# Patient Record
Sex: Female | Born: 1955 | Race: White | Hispanic: No | Marital: Married | Smoking: Never smoker
Health system: Southern US, Community
[De-identification: ages and names within clinical notes are randomized; demographics above are authoritative.]

## PROBLEM LIST (undated history)

## (undated) DIAGNOSIS — C801 Malignant (primary) neoplasm, unspecified: Secondary | ICD-10-CM

## (undated) DIAGNOSIS — N19 Unspecified kidney failure: Secondary | ICD-10-CM

## (undated) DIAGNOSIS — C210 Malignant neoplasm of anus, unspecified: Secondary | ICD-10-CM

## (undated) DIAGNOSIS — I10 Essential (primary) hypertension: Secondary | ICD-10-CM

## (undated) DIAGNOSIS — F32A Depression, unspecified: Secondary | ICD-10-CM

## (undated) DIAGNOSIS — B1920 Unspecified viral hepatitis C without hepatic coma: Secondary | ICD-10-CM

## (undated) DIAGNOSIS — F329 Major depressive disorder, single episode, unspecified: Secondary | ICD-10-CM

## (undated) HISTORY — PX: SMALL BOWEL REPAIR: SHX6447

## (undated) HISTORY — DX: Malignant neoplasm of anus, unspecified: C21.0

## (undated) HISTORY — PX: BACK SURGERY: SHX140

## (undated) HISTORY — DX: Depression, unspecified: F32.A

## (undated) HISTORY — PX: RECTAL BIOPSY: SHX2303

## (undated) HISTORY — DX: Major depressive disorder, single episode, unspecified: F32.9

## (undated) HISTORY — DX: Unspecified viral hepatitis C without hepatic coma: B19.20

## (undated) HISTORY — DX: Unspecified kidney failure: N19

## (undated) HISTORY — DX: Malignant (primary) neoplasm, unspecified: C80.1

## (undated) HISTORY — PX: OTHER SURGICAL HISTORY: SHX169

---

## 2008-05-11 HISTORY — PX: COLONOSCOPY: SHX174

## 2011-06-30 ENCOUNTER — Emergency Department (HOSPITAL_COMMUNITY)
Admission: EM | Admit: 2011-06-30 | Discharge: 2011-07-02 | Disposition: A | Discharge status: Other Acute Inpatient Hospital | Payer: PRIVATE HEALTH INSURANCE | Attending: Emergency Medicine | Admitting: Emergency Medicine

## 2011-06-30 ENCOUNTER — Encounter (HOSPITAL_COMMUNITY): Payer: Self-pay | Admitting: Emergency Medicine

## 2011-06-30 DIAGNOSIS — R45851 Suicidal ideations: Secondary | ICD-10-CM | POA: Insufficient documentation

## 2011-06-30 DIAGNOSIS — M545 Low back pain, unspecified: Secondary | ICD-10-CM | POA: Insufficient documentation

## 2011-06-30 DIAGNOSIS — I10 Essential (primary) hypertension: Secondary | ICD-10-CM | POA: Insufficient documentation

## 2011-06-30 DIAGNOSIS — F411 Generalized anxiety disorder: Secondary | ICD-10-CM | POA: Insufficient documentation

## 2011-06-30 DIAGNOSIS — F329 Major depressive disorder, single episode, unspecified: Secondary | ICD-10-CM | POA: Insufficient documentation

## 2011-06-30 DIAGNOSIS — G8929 Other chronic pain: Secondary | ICD-10-CM | POA: Insufficient documentation

## 2011-06-30 DIAGNOSIS — F3289 Other specified depressive episodes: Secondary | ICD-10-CM | POA: Insufficient documentation

## 2011-06-30 HISTORY — DX: Essential (primary) hypertension: I10

## 2011-06-30 LAB — COMPREHENSIVE METABOLIC PANEL
ALT: 36 U/L — ABNORMAL HIGH (ref 0–35)
Albumin: 4.4 g/dL (ref 3.5–5.2)
BUN: 29 mg/dL — ABNORMAL HIGH (ref 6–23)
Calcium: 11 mg/dL — ABNORMAL HIGH (ref 8.4–10.5)
GFR calc Af Amer: 67 mL/min — ABNORMAL LOW (ref >90)
Glucose, Bld: 98 mg/dL (ref 70–99)
Sodium: 143 mEq/L (ref 135–145)
Total Protein: 7.6 g/dL (ref 6.0–8.3)

## 2011-06-30 LAB — CBC
Hemoglobin: 14.1 g/dL (ref 12.0–15.0)
MCH: 30.7 pg (ref 26.0–34.0)
MCHC: 34.3 g/dL (ref 30.0–36.0)
RDW: 12.2 % (ref 11.5–15.5)

## 2011-06-30 LAB — ETHANOL: Alcohol, Ethyl (B): <11 mg/dL (ref 0–11)

## 2011-06-30 MED ORDER — LORAZEPAM 1 MG PO TABS
1.0000 mg | ORAL_TABLET | Freq: Three times a day (TID) | ORAL | Status: DC | PRN
  Administered 2011-06-30 – 2011-07-01 (×3): 1 mg via ORAL
  Filled 2011-06-30 (×3): qty 1

## 2011-06-30 MED ORDER — ACETAMINOPHEN 325 MG PO TABS: 650.0000 mg | ORAL_TABLET | ORAL | Status: DC | PRN

## 2011-06-30 MED ORDER — ZOLPIDEM TARTRATE 5 MG PO TABS
5.0000 mg | ORAL_TABLET | Freq: Once | ORAL | Status: AC
  Administered 2011-06-30: 5 mg via ORAL
  Filled 2011-06-30: qty 1

## 2011-06-30 MED ORDER — ALPRAZOLAM 0.5 MG PO TABS
1.0000 mg | ORAL_TABLET | Freq: Once | ORAL | Status: AC
  Administered 2011-06-30: 1 mg via ORAL
  Filled 2011-06-30: qty 2

## 2011-06-30 MED ORDER — CYCLOBENZAPRINE HCL 10 MG PO TABS
10.0000 mg | ORAL_TABLET | Freq: Once | ORAL | Status: AC
  Administered 2011-06-30: 10 mg via ORAL
  Filled 2011-06-30: qty 1

## 2011-06-30 NOTE — ED Provider Notes (Signed)
History     CSN: [id number]  Arrival date & time 06/30/11  1603   First MD Initiated Contact with Patient 06/30/11 1946      Chief Complaint  Patient presents with  . Suicidal    HPI Patient presents with overwhelming depression.  She notes a history of depression and anxiety, as well as chronic back pain.  She notes over the past days to weeks her depression has become pronounced enough to incapacitate her.  She notes intermittent prior suicidal thoughts, though none on my evaluation.  Her back pain is lower back, atraumatic, unchanged in the past weeks.  She denies any new narcotic use, any new other illicits.  She continues to take her medications as directed. Past Medical History  Diagnosis Date  . Depressed   . Hypertension     Past Surgical History  Procedure Date  . Back surgery     History reviewed. No pertinent family history.  History  Substance Use Topics  . Smoking status: Never Smoker   . Smokeless tobacco: Not on file  . Alcohol Use: No    OB History    Grav Para Term Preterm Abortions TAB SAB Ect Mult Living                  Review of Systems  All other systems reviewed and are negative.    Allergies  Penicillins and Codeine  Home Medications   Current Outpatient Rx  Name Route Sig Dispense Refill  . ALPRAZOLAM 1 MG PO TABS Oral Take 1 mg by mouth daily as needed. For anxiety    . ASPIRIN EC 81 MG PO TBEC Oral Take 81 mg by mouth daily.    . BUPROPION HCL ER (XL) 150 MG PO TB24 Oral Take 150 mg by mouth every morning.    Marland Kitchen CALCIUM 600 + D PO Oral Take 1 tablet by mouth every morning.    Marland Kitchen VITAMIN D 2000 UNITS PO CAPS Oral Take 1 capsule by mouth every morning.    . CYCLOBENZAPRINE HCL 10 MG PO TABS Oral Take 10 mg by mouth 3 (three) times daily as needed. For pain    . DOCUSATE SODIUM 100 MG PO CAPS Oral Take 200 mg by mouth every morning.    Marland Kitchen LANSOPRAZOLE PO Oral Take 30 mg by mouth daily as needed. For heartburn    .  LISINOPRIL-HYDROCHLOROTHIAZIDE 10-12.5 MG PO TABS Oral Take 1 tablet by mouth daily.    . ADULT MULTIVITAMIN W/MINERALS CH Oral Take 1 tablet by mouth daily.    . OXYCODONE HCL 15 MG PO TABS Oral Take 15 mg by mouth 5 (five) times daily.    Marland Kitchen VILAZODONE HCL 40 MG PO TABS Oral Take 40 mg by mouth daily.    Marland Kitchen ZOLPIDEM TARTRATE ER 12.5 MG PO TBCR Oral Take 12.5 mg by mouth daily.      BP 111/81  Pulse 88  Temp(Src) 98.1 F (36.7 C) (Oral)  Resp 19  SpO2 95%  Physical Exam  Nursing note and vitals reviewed. Constitutional: She is oriented to person, place, and time. She appears well-developed and well-nourished. No distress.  HENT:  Head: Normocephalic and atraumatic.  Eyes: Conjunctivae and EOM are normal.  Pulmonary/Chest: Effort normal. No stridor. No respiratory distress.  Musculoskeletal: She exhibits no edema.  Neurological: She is alert and oriented to person, place, and time. No cranial nerve deficit.  Skin: Skin is warm and dry.  Psychiatric: Her speech is normal. Judgment and  thought content normal. She is slowed. Cognition and memory are normal. She exhibits a depressed mood. She expresses no suicidal ideation. She expresses no suicidal plans.    ED Course  Procedures (including critical care time)  Labs Reviewed  COMPREHENSIVE METABOLIC PANEL - Abnormal; Notable for the following:    BUN 29 (*)    Calcium 11.0 (*)    ALT 36 (*)    GFR calc non Af Amer 58 (*)    GFR calc Af Amer 67 (*)    All other components within normal limits  CBC  ETHANOL  POCT PREGNANCY, URINE   No results found.   No diagnosis found.    MDM  This patient with history of chronic back pain, depression, anxiety now presents with overwhelming depression.  Although the patient denies current suicidal ideation she notes that she has had thoughts of suicide on multiple recent dictations.  Given this history of depression, endorse some suicidal ideation, she'll be evaluated by behavioral  health.  Patient is medically clear for this evaluation.        Gerhard Munch, MD 06/30/11 2225

## 2011-06-30 NOTE — ED Notes (Addendum)
Security wanded pt ?

## 2011-06-30 NOTE — ED Notes (Signed)
ACT team at bedside.  

## 2011-06-30 NOTE — ED Notes (Signed)
Pt stated that she has been extremely depressed since May 2012. Her depression has been getting worst. She stated that she does not want to live if she has to stay depressed. Currently, no plan for suicide. Sitter at bedside. Room suicide ready. Copywriter, advertising notified. Will continue to monitor.

## 2011-06-30 NOTE — ED Notes (Signed)
Pt here with c/o depression and pt states that she doesn't want to be here any more

## 2011-07-01 LAB — RAPID URINE DRUG SCREEN, HOSP PERFORMED
Cocaine: NOT DETECTED
Opiates: NOT DETECTED

## 2011-07-01 MED ORDER — OXYCODONE HCL 5 MG PO TABS
15.0000 mg | ORAL_TABLET | Freq: Once | ORAL | Status: AC
  Administered 2011-07-01: 15 mg via ORAL
  Filled 2011-07-01: qty 2
  Filled 2011-07-01: qty 1
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
## 2011-07-01 NOTE — ED Notes (Signed)
[id number]> Expected date:<BR> Expected time:<BR> Means of arrival:<BR> Comments:<BR> [facility] Patient

## 2011-07-01 NOTE — ED Notes (Signed)
Dennie Bible, RN coving for [employer], RN for lunch break.

## 2011-07-01 NOTE — ED Provider Notes (Signed)
Space available at [facility]. Will move there. Spoke with dr. Clarene Duke.  Nicholes Stairs, MD 07/01/11 [phone]

## 2011-07-01 NOTE — ED Notes (Signed)
Patient up to rest room and back to room.

## 2011-07-01 NOTE — ED Notes (Signed)
Crying she is severely depressed, there are no beds at [facility], crying because she misses her dogs, c/o pain in low back and pressure.

## 2011-07-02 ENCOUNTER — Inpatient Hospital Stay (HOSPITAL_COMMUNITY)
Admission: AD | Admit: 2011-07-02 | Discharge: 2011-07-07 | DRG: 885 | Disposition: A | Discharge status: Home / Self Care | Payer: PRIVATE HEALTH INSURANCE | Source: Ambulatory Visit | Attending: Psychiatry | Admitting: Psychiatry

## 2011-07-02 DIAGNOSIS — F332 Major depressive disorder, recurrent severe without psychotic features: Principal | ICD-10-CM | POA: Diagnosis present

## 2011-07-02 DIAGNOSIS — I10 Essential (primary) hypertension: Secondary | ICD-10-CM

## 2011-07-02 DIAGNOSIS — IMO0002 Reserved for concepts with insufficient information to code with codable children: Secondary | ICD-10-CM

## 2011-07-02 DIAGNOSIS — F19939 Other psychoactive substance use, unspecified with withdrawal, unspecified: Secondary | ICD-10-CM

## 2011-07-02 DIAGNOSIS — Z7982 Long term (current) use of aspirin: Secondary | ICD-10-CM

## 2011-07-02 DIAGNOSIS — Z880 Allergy status to penicillin: Secondary | ICD-10-CM

## 2011-07-02 DIAGNOSIS — R197 Diarrhea, unspecified: Secondary | ICD-10-CM

## 2011-07-02 DIAGNOSIS — Z79899 Other long term (current) drug therapy: Secondary | ICD-10-CM

## 2011-07-02 DIAGNOSIS — F1120 Opioid dependence, uncomplicated: Secondary | ICD-10-CM

## 2011-07-02 DIAGNOSIS — Z818 Family history of other mental and behavioral disorders: Secondary | ICD-10-CM

## 2011-07-02 DIAGNOSIS — R45851 Suicidal ideations: Secondary | ICD-10-CM

## 2011-07-02 LAB — COMPREHENSIVE METABOLIC PANEL
ALT: 32 U/L (ref 0–35)
AST: 26 U/L (ref 0–37)
Albumin: 4 g/dL (ref 3.5–5.2)
CO2: 30 mEq/L (ref 19–32)
Calcium: 10.1 mg/dL (ref 8.4–10.5)
Creatinine, Ser: 0.9 mg/dL (ref 0.50–1.10)
Sodium: 138 mEq/L (ref 135–145)
Total Protein: 6.9 g/dL (ref 6.0–8.3)

## 2011-07-02 LAB — URINE MICROSCOPIC-ADD ON

## 2011-07-02 LAB — URINALYSIS, ROUTINE W REFLEX MICROSCOPIC
Glucose, UA: NEGATIVE mg/dL
Hgb urine dipstick: NEGATIVE
Specific Gravity, Urine: 1.028 (ref 1.005–1.030)
pH: 5.5 (ref 5.0–8.0)

## 2011-07-02 MED ORDER — BUPROPION HCL ER (XL) 150 MG PO TB24
150.0000 mg | ORAL_TABLET | Freq: Every day | ORAL | Status: DC
  Administered 2011-07-02 – 2011-07-07 (×6): 150 mg via ORAL
  Filled 2011-07-02 (×7): qty 1

## 2011-07-02 MED ORDER — CALCIUM CARBONATE-VITAMIN D 500-200 MG-UNIT PO TABS
1.0000 | ORAL_TABLET | Freq: Every day | ORAL | Status: DC
  Administered 2011-07-02 – 2011-07-07 (×6): 1 via ORAL
  Filled 2011-07-02 (×7): qty 1

## 2011-07-02 MED ORDER — GABAPENTIN 300 MG PO CAPS
300.0000 mg | ORAL_CAPSULE | Freq: Three times a day (TID) | ORAL | Status: DC
  Administered 2011-07-02 – 2011-07-03 (×3): 300 mg via ORAL
  Filled 2011-07-02 (×5): qty 1

## 2011-07-02 MED ORDER — ALPRAZOLAM 1 MG PO TABS
1.0000 mg | ORAL_TABLET | Freq: Every day | ORAL | Status: DC | PRN
  Administered 2011-07-02 – 2011-07-03 (×2): 1 mg via ORAL
  Filled 2011-07-02 (×2): qty 1

## 2011-07-02 MED ORDER — CYCLOBENZAPRINE HCL 10 MG PO TABS
10.0000 mg | ORAL_TABLET | Freq: Three times a day (TID) | ORAL | Status: DC | PRN
  Administered 2011-07-05 – 2011-07-07 (×2): 10 mg via ORAL
  Filled 2011-07-02 (×2): qty 1

## 2011-07-02 MED ORDER — ADULT MULTIVITAMIN W/MINERALS CH
1.0000 | ORAL_TABLET | Freq: Every day | ORAL | Status: DC
  Administered 2011-07-02 – 2011-07-07 (×6): 1 via ORAL
  Filled 2011-07-02 (×7): qty 1

## 2011-07-02 MED ORDER — MAGNESIUM HYDROXIDE 400 MG/5ML PO SUSP: 30.0000 mL | Freq: Every day | ORAL | Status: DC | PRN

## 2011-07-02 MED ORDER — ALUM & MAG HYDROXIDE-SIMETH 200-200-20 MG/5ML PO SUSP: 30.0000 mL | ORAL | Status: DC | PRN

## 2011-07-02 MED ORDER — ASPIRIN 81 MG PO CHEW
81.0000 mg | CHEWABLE_TABLET | Freq: Every day | ORAL | Status: DC
  Administered 2011-07-02 – 2011-07-07 (×6): 81 mg via ORAL
  Filled 2011-07-02 (×7): qty 1

## 2011-07-02 MED ORDER — ZOLPIDEM TARTRATE 10 MG PO TABS: 10.0000 mg | ORAL_TABLET | Freq: Every day | ORAL | Status: DC

## 2011-07-02 MED ORDER — AMITRIPTYLINE HCL 25 MG PO TABS
25.0000 mg | ORAL_TABLET | Freq: Every day | ORAL | Status: DC
  Administered 2011-07-02 – 2011-07-07 (×5): 25 mg via ORAL
  Filled 2011-07-02 (×7): qty 1

## 2011-07-02 MED ORDER — NICOTINE 21 MG/24HR TD PT24
21.0000 mg | MEDICATED_PATCH | Freq: Every day | TRANSDERMAL | Status: DC
  Administered 2011-07-06: 21 mg via TRANSDERMAL
  Filled 2011-07-02 (×7): qty 1

## 2011-07-02 MED ORDER — NAPROXEN 500 MG PO TABS
500.0000 mg | ORAL_TABLET | Freq: Two times a day (BID) | ORAL | Status: DC
  Administered 2011-07-02 – 2011-07-07 (×10): 500 mg via ORAL
  Filled 2011-07-02 (×14): qty 1

## 2011-07-02 MED ORDER — VITAMIN D3 25 MCG (1000 UNIT) PO TABS
2000.0000 [IU] | ORAL_TABLET | Freq: Every morning | ORAL | Status: DC
  Administered 2011-07-02 – 2011-07-07 (×6): 2000 [IU] via ORAL
  Filled 2011-07-02 (×8): qty 2

## 2011-07-02 MED ORDER — ACETAMINOPHEN 325 MG PO TABS
650.0000 mg | ORAL_TABLET | Freq: Four times a day (QID) | ORAL | Status: DC | PRN
Start: 2011-07-02 — End: 2011-07-07

## 2011-07-02 MED ORDER — HYDROCHLOROTHIAZIDE 12.5 MG PO CAPS
12.5000 mg | ORAL_CAPSULE | Freq: Every day | ORAL | Status: DC
  Administered 2011-07-02 – 2011-07-07 (×5): 12.5 mg via ORAL
  Filled 2011-07-02 (×8): qty 1

## 2011-07-02 MED ORDER — LISINOPRIL 10 MG PO TABS
10.0000 mg | ORAL_TABLET | Freq: Every day | ORAL | Status: DC
  Administered 2011-07-02 – 2011-07-07 (×5): 10 mg via ORAL
  Filled 2011-07-02 (×7): qty 1

## 2011-07-02 MED ORDER — PANTOPRAZOLE SODIUM 40 MG PO TBEC
40.0000 mg | DELAYED_RELEASE_TABLET | Freq: Every day | ORAL | Status: DC
  Administered 2011-07-02 – 2011-07-07 (×6): 40 mg via ORAL
  Filled 2011-07-02 (×8): qty 1

## 2011-07-02 MED ORDER — OXYCODONE HCL 5 MG PO TABS
15.0000 mg | ORAL_TABLET | Freq: Three times a day (TID) | ORAL | Status: DC
  Administered 2011-07-02: 15 mg via ORAL
  Filled 2011-07-02: qty 3

## 2011-07-02 MED ORDER — LIDOCAINE 5 % EX PTCH
2.0000 | MEDICATED_PATCH | Freq: Every day | CUTANEOUS | Status: DC
  Administered 2011-07-02 – 2011-07-07 (×6): 2 via TRANSDERMAL
  Filled 2011-07-02 (×6): qty 2

## 2011-07-02 MED ORDER — DOCUSATE SODIUM 100 MG PO CAPS
200.0000 mg | ORAL_CAPSULE | Freq: Every day | ORAL | Status: DC
  Administered 2011-07-02 – 2011-07-05 (×4): 200 mg via ORAL
  Filled 2011-07-02 (×9): qty 2

## 2011-07-02 NOTE — Progress Notes (Signed)
Patient has been crying this morning.   Husband came for lunch, and patient appears to be happy.   On patient's self inventory form, has poor sleep, poor appetite, low energy level, poor attention span.   Rated depression #10, hopelessness #7.  Denied SI.  Had back pain in past 24 hours.
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
## 2011-07-02 NOTE — Progress Notes (Signed)
07/02/2011 11:45 AM                                            Case Management Note: Pt did not attend after care group but attended treatment team as was too emotional to attend in morning. Pt shared her depression and anxiety levels are high. Pt shared she has lost her job and her dog and feels she has no purpose. Pt also shared she has bad back pain. During TX team meds changed. Pt needs follow-up. Elliott Quade, LPCA

## 2011-07-02 NOTE — Discharge Instructions (Signed)
Depression      Depression is a strong emotion of feeling unhappy that can last for weeks, months, or even longer. Depression causes problems with the ability to function in life. It upsets your:   · Relationships.   · Sleep.   · Eating habits.   · Work habits.   HOME CARE  · Take all medicine as told by your doctor.   · Talk with a therapist, counselor, or friend.   · Eat a healthy diet.   · Exercise regularly.   · Do not drink alcohol or use drugs.   GET HELP RIGHT AWAY IF:  You start to have thoughts about hurting yourself or others.  MAKE SURE YOU:  · Understand these instructions.   · Will watch your condition.   · Will get help right away if you are not doing well or get worse.   Document Released: 05/30/2010 Document Revised: 01/07/2011 Document Reviewed: 05/30/2010  [employer]® Patient Information ©2012 [employer].

## 2011-07-02 NOTE — ED Provider Notes (Signed)
Transfer to [facility], MD 07/02/11 [phone]
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
## 2011-07-02 NOTE — Tx Team (Addendum)
Initial Interdisciplinary Treatment Plan  PATIENT STRENGTHS: (choose at least two) Ability for insight Average or above average intelligence Communication skills Financial means General fund of knowledge Religious Affiliation  PATIENT STRESSORS: Health problems Loss of job, dog* Traumatic event   PROBLEM LIST: Problem List/Patient Goals Date to be addressed Date deferred Reason deferred Estimated date of resolution  Depression 07-02-11     Passive SI 07-02-11     Chronic back pain 07-02-11                                          DISCHARGE CRITERIA:  Improved stabilization in mood, thinking, and/or behavior Motivation to continue treatment in a less acute level of care Need for constant or close observation no longer present Verbal commitment to aftercare and medication compliance  PRELIMINARY DISCHARGE PLAN: Attend aftercare/continuing care group  PATIENT/FAMIILY INVOLVEMENT: This treatment plan has been presented to and reviewed with the patient, Jillian Estrada, and/or family membe.  The patient and family have been given the opportunity to ask questions and make suggestions.  Mickeal Needy 07/02/2011, 2:38 AM

## 2011-07-02 NOTE — Progress Notes (Signed)
Patient ID: Jillian Estrada, female   DOB: 1955/09/02, 56 y.o.   MRN: [id number] Pt. Is a 56 y.o. Female admitted for reports of increasing depression. She reports stressors as "pressure at work after back surgery, no leave time" "retired from job of 35 years" "having to give up my dog". Pt. 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 Pt. Has hx of two back surgeries 10/08 and 9/11. Pt. Currently having passive SI "It would be better if I wasn't here", but contracts for safety. Pt. Crying , irritable and assertive. Staff offered something to eat/drink. Pt. Oriented to unit and room. Staff will monitor q38min for safety.

## 2011-07-02 NOTE — Progress Notes (Signed)
Interdisciplinary Treatment Plan Update (Adult)  Date:  07/02/2011 11:11 AM  Time Reviewed:    Progress in Treatment: Attending groups: Yes Participating in groups:  Yes Taking medication as prescribed: Yes Tolerating medication:  Yes Family/Significant othe contact made: counselor will make contact   Patient understands diagnosis:  Yes Discussing patient identified problems/goals with staff:  Yes Medical problems stabilized or resolved:  Yes Denies suicidal/homicidal ideation: Yes Issues/concerns per patient self-inventory:  None identified Other:  New problem(s) identified: none  Reason for Continuation of Hospitalization: Depression Anxiety Med. Change   Interventions implemented related to continuation of hospitalization: Medication monitoring and adjustment, safety checks q 15 mins, group therapy, psychoeducation for coping skills, collateral contact and discharge planning.   Additional comments: none  Estimated length of stay: 3-5 days  Discharge Plan: Pt will follow-up with outpatient care  New goal(s):  Review of initial/current patient goals per problem list:   1.  Goal(s): Coren will deny SI/HI  Met:  yes  Target date: discharge  As evidenced by: Pt denies SI/HI  2.  Goal (s): Leilah will rate level of depression from 10 to 3  Met:  no  Target date:discharge  As evidenced by: Pt rates depression at 10  3.  Goal(s): Aaylah will rate level of anxiety from 10 to 3  Met:  no  Target date:discharge  As evidenced by: Pt rates anxiety at a 10  4.  Goal(s):  Met:    Target date:  As evidenced by:    Attendees: Patient:  Jillian Estrada 07/02/2011 11:12 AM   Family:     Physician:  Orson Aloe, MD 07/02/2011  11:27 AM   Nursing:   Omelia Blackwater, RN 07/02/2011 11:27 AM   Case Manager:  Vanetta Mulders, LPCA 07/02/2011  11:27 AM   Counselor:      Other:  Reyes Ivan, LCSW 07/02/2011 11:27 AM   Other:     Other:     Other:      Scribe for  Treatment Team:   Purcell Nails, LPCA 07/02/2011 11:14 AM

## 2011-07-02 NOTE — H&P (Signed)
Medical/psychiatric screening examination/treatment/procedure(s) were performed by non-physician practitioner and as supervising physician I was immediately available for consultation/collaboration.   I have seen and examined this patient and agree with this evaluation.  

## 2011-07-03 LAB — T3, FREE: T3, Free: 2.9 pg/mL (ref 2.3–4.2)

## 2011-07-03 MED ORDER — TRAZODONE HCL 50 MG PO TABS
50.0000 mg | ORAL_TABLET | Freq: Every evening | ORAL | Status: DC | PRN
  Administered 2011-07-03 – 2011-07-07 (×4): 50 mg via ORAL
  Filled 2011-07-03 (×12): qty 1

## 2011-07-03 MED ORDER — CLONIDINE HCL 0.1 MG/24HR TD PTWK
0.1000 mg | MEDICATED_PATCH | TRANSDERMAL | Status: DC
  Administered 2011-07-03: 0.1 mg via TRANSDERMAL
  Filled 2011-07-03: qty 1

## 2011-07-03 MED ORDER — GABAPENTIN 300 MG PO CAPS
600.0000 mg | ORAL_CAPSULE | Freq: Three times a day (TID) | ORAL | Status: DC
  Administered 2011-07-03 – 2011-07-07 (×12): 600 mg via ORAL
  Filled 2011-07-03 (×17): qty 2

## 2011-07-03 NOTE — Progress Notes (Signed)
[facility] Group Notes:  (Counselor/Nursing/MHT/Case Management/Adjunct)  07/03/2011 11:00AM  Type of Therapy:  Group Therapy  Participation Level:  Active  Participation Quality:  Resistant and Sharing  Affect:  Depressed  Cognitive:  Alert and Oriented  Insight:  Good  Engagement in Group:  Good  Engagement in Therapy:  Good  Modes of Intervention:  Clarification, Problem-solving, Socialization, Support and Exploratory  Summary of Progress/Problems: Pt verbalized that before coming to the hospital, she was experiencing the following: being over whlemed, being afraid, and hopeless. Pt appeared very depressed during group and at times became tearful. Pt reported that she has a lot of things going on and feels as though she has no one to turn to. Pt is very worried about what's going to happen with her parents and her current relationship with her husband. Pt stated that she is even more depressed after losing her job of 35 years. Pt stated that the first thoughts that come to her in the morning are always negative thoughts. Pt stated that she was prescribed several medications that at times she felt were difficult to take or remain compliant.   Greg Eckrich, Randal Buba 07/03/2011, 12:45 PM

## 2011-07-03 NOTE — Progress Notes (Signed)
Patient seen to assess for discharge needs.  She advised for becoming very depressed in 10-30-2022 following the death of her dog.  She reports she has become more depressed in October following retirement from her job of 35 years.  She advised of becoming more and more tearful and wants to be normal again.  Patient shared she is followed by a therapist and Nurse Practitioner in [place], [place].  She denies SI/HI.  She reports having a home, transportation, access to medications and a supportive husband.

## 2011-07-03 NOTE — Progress Notes (Signed)
[facility] Group Notes:  (Counselor/Nursing/MHT/Case Management/Adjunct)  07/03/2011 3:53 PM  Type of Therapy:  1:15PM Group Therapy  Participation Level:  None  Participation Quality:  Attentive  Affect:  Depressed and Flat  Cognitive:  Appropriate  Insight:  None  Engagement in Group:  None  Engagement in Therapy:  None  Modes of Intervention:  Education  Summary of Progress/Problems: Patient appeared somewhat engaged in the information presented by [employer]'s peer speaker. Patient was called out of group by the doctor towards the end of the session.   Wilmon Arms 07/03/2011, 3:53 PM  Cosigned by: Angus Palms, LCSW

## 2011-07-03 NOTE — Progress Notes (Signed)
Patient ID: Jillian Estrada, female   DOB: 18-Feb-1956, 56 y.o.   MRN: [id number] Pt was seen earlier today.  She describes feeling very distraught and WORSE than when she came into the hospital.  She describes feeling worse in terms of being so nervous that she is not able to express herself.  She is worried about her husband's visit tonight.  He had visited for lunch yesterday.  She is wanting to appear and be better so that both she and her husband could see that this hospital stay is being worth the effort of being here.  She was assured that she had been on addictive opiates and benzos and that it will take some time to get off those addictive substances.   I added clonidine to help with the opiate withdrawal.  She was reassured that today and tomorrow will probably be her worse days.  She was only partially relieved by that.

## 2011-07-03 NOTE — Progress Notes (Signed)
Pt presents sad and depressed in affect. Pt was crying during day shift, but wasn't noted for tears during this evening. Pt returned from [place] with a sad affect as well. Despite pt being  observed interacting and mingling with others. Pt has been encouraged to continue to interact with others on the unit. Pt is denying SI at this time. Pt woke up complaining of not being able to sleep. Pt was offered xanax to relieve her anxiety and to help with sleep. Pt was informed that dose could be given after midnight in 20 mins. Pt was asleep at midnight. Pt briefly woke up at 0150. Her lidocaine patch was removed  and a xanax was administered. Pt went back to sleep shortly afterwards. Support and availability as needed has been extended to this pt.

## 2011-07-04 MED ORDER — ARIPIPRAZOLE 5 MG PO TABS
5.0000 mg | ORAL_TABLET | Freq: Once | ORAL | Status: AC
  Administered 2011-07-04: 5 mg via ORAL

## 2011-07-04 MED ORDER — ARIPIPRAZOLE 5 MG PO TABS
5.0000 mg | ORAL_TABLET | ORAL | Status: DC
  Administered 2011-07-05 – 2011-07-07 (×3): 5 mg via ORAL
  Filled 2011-07-04 (×7): qty 1

## 2011-07-04 NOTE — Progress Notes (Addendum)
[facility] Adult Inpatient Family/Significant Other Suicide Prevention Education  Suicide Prevention Education:  Contact Attempts: Pt's husband Greggory Stallion [phone] has been identified by the patient as the family member/significant other with whom the patient will be residing, and identified as the person(s) who will aid the patient in the event of a mental health crisis.  With written consent from the patient, two attempts were made to provide suicide prevention education, prior to and/or following the patient's discharge.  We were unsuccessful in providing suicide prevention education.  A suicide education pamphlet was given to the patient to share with family/significant other.  Date and time of first attempt:07/04/2011 3:03 PM wrong number- counselor will attempt to get another number  Date and time of second attempt:  Purcell Nails 07/04/2011, 3:03 PM

## 2011-07-04 NOTE — Progress Notes (Signed)
Slept fair last nite, appetite is good, energy level is low and ability to pay attention is improving, depressed 8/10 and hopeless 8/10, denies SI or HI, is concerned about her thinking and pain, just doesn't know what to do. q38min safety checks continue and support offered, encouraged her to speak to her MD about the pain and what else she can do or get help with and to talk out her issues and worries. q63min safety checks continue and support offered Safety maintained

## 2011-07-04 NOTE — Progress Notes (Signed)
Pt has been noted to be tear free during this evening. Pt is still presenting with a sad affect. Pt has not asked for any prns this evening. Pt has been calm cooperative, and polite this evening. Writer will continue to support pt. Pt safety remains with q30min checks.

## 2011-07-04 NOTE — Progress Notes (Signed)
[facility] Group Notes:  (Counselor/Nursing/MHT/Case Management/Adjunct)  07/04/2011 1315  Type of Therapy:  Group Therapy  Participation Level:  Active  Participation Quality:  Appropriate, Attentive and Sharing  Affect:  Appropriate  Cognitive:  Appropriate  Insight:  Good  Engagement in Group:  Good  Engagement in Therapy:  Good  Modes of Intervention:  Activity, Clarification, Problem-solving and Socialization  Summary of Progress/Problems: Pt attended and participated in group session on Self-sabotage and enabling. Pt was asked how they self-sabotage. Pt states that she self-sabotages herself by not getting out of the house. Pt states that her husband enables her to stay in the house because he handles all of the errands and accomodates her so that she does not have to leave the home. Pt was able to process and gain awareness of how she sabotages herself during group.    Jacques Fife 07/04/2011, 2:40 PM

## 2011-07-04 NOTE — Progress Notes (Signed)
  Jillian Estrada is a 56 y.o. female [id number] December 05, 1955  07/02/2011 Active Problems:  Major depressive disorder, recurrent episode, severe   Mental Status: Alert & oriented . Mood and affect constrained. Denies active SI/HI is not psychotic.   Subjective/Objective: Retired 03/10/12 from employment of past 35 years . Doesn't quite knw how to stay busy but also doesn't think she can stay focused enough to do anything. Husband is visiting which makes her hopeful.     Filed Vitals:   07/04/11 0814  BP: 103/69  Pulse: 99  Temp:   Resp:     Lab Results:   BMET    Component Value Date/Time   NA 138 07/02/2011 0617   K 4.7 07/02/2011 0617   CL 102 07/02/2011 0617   CO2 30 07/02/2011 0617   GLUCOSE 98 07/02/2011 0617   BUN 18 07/02/2011 0617   CREATININE 0.90 07/02/2011 0617   CALCIUM 10.1 07/02/2011 0617   GFRNONAA 70* 07/02/2011 0617   GFRAA 81* 07/02/2011 0617    Medications:  Scheduled:     . amitriptyline  25 mg Oral QHS  . aspirin  81 mg Oral Daily  . buPROPion  150 mg Oral Daily  . calcium-vitamin D  1 tablet Oral Daily  . cholecalciferol  2,000 Units Oral q morning - 10a  . cloNIDine  0.1 mg Transdermal Weekly  . docusate sodium  200 mg Oral Daily  . gabapentin  600 mg Oral TID  . hydrochlorothiazide  12.5 mg Oral Daily  . lidocaine  2 patch Transdermal Daily  . lisinopril  10 mg Oral Daily  . mulitivitamin with minerals  1 tablet Oral Daily  . naproxen  500 mg Oral BID WC  . nicotine  21 mg Transdermal Q0600  . pantoprazole  40 mg Oral Daily  . traZODone  50 mg Oral QHS,MR X 1     PRN Meds acetaminophen, alum & mag hydroxide-simeth, cyclobenzaprine, magnesium hydroxide  Plan: add Abilify to augment antidepressant  Anandi Abramo,MICKIE D. 07/04/2011

## 2011-07-04 NOTE — Progress Notes (Signed)
Pt. attended and participated in aftercare planning group. Suicide prevention information was given as well as suicide risk warning sign information and crisis line numbers to use. Pt stated that she was admitted due to pain and severe depression. Pt states that her depression began last May 2012 when she was having stress at work. Pt retired last year from her job and has been depressed ever since. Pt denies SI and HI.

## 2011-07-05 MED ORDER — GUAIFENESIN 100 MG/5ML PO SOLN
5.0000 mL | ORAL | Status: DC | PRN
  Administered 2011-07-05: 100 mg via ORAL

## 2011-07-05 NOTE — Progress Notes (Deleted)
  Jillian Estrada is a 56 y.o. female [id number] March 28, 1956  07/02/2011 Active Problems:  Major depressive disorder, recurrent episode, severe   Mental Status: Alert and oriented. Mood and affect seems brighter than yesterday. Is not suicidal homicidal or psychotic.  Subjective/Objective: Up dressed in hospital gowns slept a little better. Son helps her pay her bills- doesn't want to be a burden has been trying for SSDI for a few years is working with [employer].Feels that meds are ok.     Filed Vitals:   07/05/11 0731  BP: 119/84  Pulse: 110  Temp:   Resp:     Lab Results:   BMET    Component Value Date/Time   NA 138 07/02/2011 0617   K 4.7 07/02/2011 0617   CL 102 07/02/2011 0617   CO2 30 07/02/2011 0617   GLUCOSE 98 07/02/2011 0617   BUN 18 07/02/2011 0617   CREATININE 0.90 07/02/2011 0617   CALCIUM 10.1 07/02/2011 0617   GFRNONAA 70* 07/02/2011 0617   GFRAA 81* 07/02/2011 0617    Medications:  Scheduled:     . amitriptyline  25 mg Oral QHS  . ARIPiprazole  5 mg Oral BH-q7a  . ARIPiprazole  5 mg Oral Once  . aspirin  81 mg Oral Daily  . buPROPion  150 mg Oral Daily  . calcium-vitamin D  1 tablet Oral Daily  . cholecalciferol  2,000 Units Oral q morning - 10a  . cloNIDine  0.1 mg Transdermal Weekly  . docusate sodium  200 mg Oral Daily  . gabapentin  600 mg Oral TID  . hydrochlorothiazide  12.5 mg Oral Daily  . lidocaine  2 patch Transdermal Daily  . lisinopril  10 mg Oral Daily  . mulitivitamin with minerals  1 tablet Oral Daily  . naproxen  500 mg Oral BID WC  . nicotine  21 mg Transdermal Q0600  . pantoprazole  40 mg Oral Daily  . traZODone  50 mg Oral QHS,MR X 1     PRN Meds acetaminophen, alum & mag hydroxide-simeth, cyclobenzaprine, magnesium hydroxide Plan: continue current plan of care today  Encourage her to dress in her own clothes.  [name],MICKIE D. 07/05/2011

## 2011-07-05 NOTE — Progress Notes (Signed)
Pt. attended and participated in aftercare planning group. [facility] support group information was given as well as information on suicide prevention information, warning signs to look for with suicide and crisis line numbers to use. Pt states that she does not have any aftercare plans. Pt denies SI and HI.

## 2011-07-05 NOTE — Progress Notes (Signed)
D:  Pt attended wrap-up group this evening.  Pt expressed bad day nothing good to report.  A:  Encouraged interactions with peers and in groups.  R:  Pt is safe. Aundria Rud, Derriana Oser L, MHT/NS

## 2011-07-05 NOTE — Progress Notes (Signed)
  Vertis Bauder is a 56 y.o. female [id number] 30-Dec-1955  07/02/2011 Active Problems:  Major depressive disorder, recurrent episode, severe   Mental Status: alert and oriented mood is brighter denies SI/HI/AVH   Subjective/Objective: Slept allright. Not sure if she notices anything since adding Abilify but objectively is noticably more active.   Filed Vitals:   07/05/11 0731  BP: 119/84  Pulse: 110  Temp:   Resp:     Lab Results:   BMET    Component Value Date/Time   NA 138 07/02/2011 0617   K 4.7 07/02/2011 0617   CL 102 07/02/2011 0617   CO2 30 07/02/2011 0617   GLUCOSE 98 07/02/2011 0617   BUN 18 07/02/2011 0617   CREATININE 0.90 07/02/2011 0617   CALCIUM 10.1 07/02/2011 0617   GFRNONAA 70* 07/02/2011 0617   GFRAA 81* 07/02/2011 0617    Medications:  Scheduled:     . amitriptyline  25 mg Oral QHS  . ARIPiprazole  5 mg Oral BH-q7a  . ARIPiprazole  5 mg Oral Once  . aspirin  81 mg Oral Daily  . buPROPion  150 mg Oral Daily  . calcium-vitamin D  1 tablet Oral Daily  . cholecalciferol  2,000 Units Oral q morning - 10a  . cloNIDine  0.1 mg Transdermal Weekly  . docusate sodium  200 mg Oral Daily  . gabapentin  600 mg Oral TID  . hydrochlorothiazide  12.5 mg Oral Daily  . lidocaine  2 patch Transdermal Daily  . lisinopril  10 mg Oral Daily  . mulitivitamin with minerals  1 tablet Oral Daily  . naproxen  500 mg Oral BID WC  . nicotine  21 mg Transdermal Q0600  . pantoprazole  40 mg Oral Daily  . traZODone  50 mg Oral QHS,MR X 1     PRN Meds acetaminophen, alum & mag hydroxide-simeth, cyclobenzaprine, magnesium hydroxide Plan: continue current plan of care   Donterrius Santucci,MICKIE D. 07/05/2011

## 2011-07-05 NOTE — Progress Notes (Signed)
Slept fairly well last nite, appetite is improving, ability to pay attention is improving, depressed 8/10 and hopeless 6/10, denies Si or Hi, attending group, does not talk very much, taking meds as ordered by MD, seems better this morning than yesterday, taking it "one day at a time". q2min safety checks continue and support offered Safety maintained

## 2011-07-05 NOTE — Progress Notes (Signed)
[facility] Adult Inpatient Family/Significant Other Suicide Prevention Education  Suicide Prevention Education:  Education Completed; pt's husband Greggory Stallion [phone] has been identified by the patient as the family member/significant other with whom the patient will be residing, and identified as the person(s) who will aid the patient in the event of a mental health crisis (suicidal ideations/suicide attempt).  With written consent from the patient, the family member/significant other has been provided the following suicide prevention education, prior to the and/or following the discharge of the patient.  The suicide prevention education provided includes the following:  Suicide risk factors  Suicide prevention and interventions  National Suicide Hotline telephone number  [facility] assessment telephone number  [facility] Emergency Assistance 911  [facility] and/or Residential Mobile Crisis Unit telephone number  Request made of family/significant other to:  Remove weapons (e.g., guns, rifles, knives), all items previously/currently identified as safety concern.    Remove drugs/medications (over-the-counter, prescriptions, illicit drugs), all items previously/currently identified as a safety concern.  The family member/significant other verbalizes understanding of the suicide prevention education information provided.  The family member/significant other agrees to remove the items of safety concern listed above.  Purcell Nails 07/05/2011, 9:54 AM

## 2011-07-05 NOTE — Progress Notes (Signed)
[facility] Group Notes:  (Counselor/Nursing/MHT/Case Management/Adjunct)  07/05/2011 5:20 PM  Type of Therapy:  Group Therapy  Participation Level:  Active  Participation Quality:  Appropriate  Affect:  Appropriate  Cognitive:  Alert and Appropriate  Insight:  Good  Engagement in Group:  Good  Engagement in Therapy:  Good  Modes of Intervention:  Clarification and Support  Summary of Progress/Problems: Pt. Participated in group on supports and when they are healthy and unhealthy. Each pt. identified who was a support for them and what to do when their supports are not there. Each pt was encouraged to seek support through supports groups and thorough the person that they identified. Pt. stated that her husband was a support for her and that he takes the burden off her and does everything for her.   Jillian Estrada [place] 07/05/2011, 5:20 PM

## 2011-07-05 NOTE — Progress Notes (Signed)
Patient ID: Jillian Estrada, female   DOB: 1956/01/06, 56 y.o.   MRN: [id number] Pt. denies lethality and A/V/H's; mood is quietly depressed and Pt. Is calm and somewhat withdrawn. Denies any other problems tonight.

## 2011-07-05 NOTE — Progress Notes (Signed)
D:  Pt attended wrap up group this evening.  She expressed enjoyment with outside time.  In addition, pt says she struggles with whether she needs her medications, but takes it.  A:  Pt encourage to strength her support system and have medication conversations with RN and MD.  R:  Pt is safe. Aundria Rud, Zavien Clubb L, MHT/NS

## 2011-07-06 DIAGNOSIS — F332 Major depressive disorder, recurrent severe without psychotic features: Principal | ICD-10-CM

## 2011-07-06 NOTE — Tx Team (Signed)
Interdisciplinary Treatment Plan Update (Adult)  Date:  07/06/2011  Time Reviewed:  10:41 AM   Progress in Treatment: Attending groups: Yes Participating in groups:  Yes Taking medication as prescribed: Yes Tolerating medication:  Yes Family/Significant othe contact made:  Yes, contact made with husband Patient understands diagnosis:  Yes Discussing patient identified problems/goals with staff:  Yes Medical problems stabilized or resolved:  Yes Denies suicidal/homicidal ideation: Yes Issues/concerns per patient self-inventory:  None identified Other: N/A  New problem(s) identified: None Identified  Reason for Continuation of Hospitalization: Anxiety Depression Medication stabilization  Interventions implemented related to continuation of hospitalization: mood stabilization, medication monitoring and adjustment, group therapy and psycho education, safety checks q 15 mins  Additional comments: N/A  Estimated length of stay: 3-5 days  Discharge Plan: SW will assess for appropriate referrals.    New goal(s): N/A  Review of initial/current patient goals per problem list:    1.  Goal(s): Reduce depressive symptoms  Met:  No  Target date: by discharge  As evidenced by: Reducing depression from a 10 to a 3 as reported by pt.  Pt ranks at a 5 today.   2.  Goal (s): Reduce/Eliminate suicidal ideation  Met:  No  Target date: by discharge  As evidenced by: pt reporting no SI.    3.  Goal(s): Reduce anxiety symptoms  Met:  No  Target date: by discharge  As evidenced by: Reduce anxiety from a 10 to a 3 as reported by pt.  Pt ranks at a 5 today.    Attendees: Patient:     Family:     Physician:  Orson Aloe, MD  07/06/2011  10:41 AM   Nursing:   Quintella Reichert, RN 07/06/2011 10:43 AM   Case Manager:  Reyes Ivan, LCSWA 07/06/2011  10:41 AM   Counselor:  Angus Palms, LCSW 07/06/2011  10:41 AM   Other:  Juline Patch, LCSW 07/06/2011  10:41 AM   Other:   Gretta Arab, RN 07/06/2011  10:41 AM   Other:     Other:      Scribe for Treatment Team:   Carmina Miller, 07/06/2011 , 10:41 AM

## 2011-07-06 NOTE — Progress Notes (Signed)
[facility] Group Notes:  (Counselor/Nursing/MHT/Case Management/Adjunct)    Type of Therapy:  Group Therapy  Participation Level:  Did Not Attend       [name] 07/06/2011  2:44 PM

## 2011-07-06 NOTE — Progress Notes (Signed)
Pt denies SI/HI/AVH. Pt had multiple episodes of diarrhea today. Stool sample sent to test for c diff. Pt depressed because of her health. Pt remained in bed throughout the day. Pt complained of weakness. Gatorade given. Pt rates her depression and hopelessness as a 6.

## 2011-07-06 NOTE — Progress Notes (Signed)
Pt attended discharge planning group and actively participated.  Pt presents with flat affect and depressed mood.  Pt ranks depression and anxiety at a 5 today.  Pt denies SI.  Pt states that she has been sick since yesterday.  Pt states that because of her illness she feels she is not dealing with her depression today.  Pt states she lives near [place] and has transportation home.  Pt states that she sees Dr. Tomasa Rand at [facility] but would like a referral to a different psychiatrist.  Pt reports having a therapist in [place].  SW will assess for appropriate referrals.  Safety planning and suicide prevention discussed.     Reyes Ivan, LCSWA 07/06/2011  10:40 AM

## 2011-07-06 NOTE — Progress Notes (Signed)
Patient ID: Jillian Estrada, female   DOB: 29-Dec-1955, 56 y.o.   MRN: [id number] Pt seen briefly this AM and again this PM.  She has been improving in her pain management, but has developed diarrhea.  Stool for C diff was sent off this AM.  Results not known at the tie of speaking with her this PM.  She noted her mood to be 5/10 and anxiety as 5/10 on a scale of 1 is the best and 10 is the worst this AM.  This PM she notes that those ratings are about the same. She noted that she did not get her patch this AM and so I ordered that her patch be applied tonight.  However, the nurse that had placed the patches this AM called me back to check what I wanted to be done and in fact will simply keep her on the same schedule and hopefully she will remember getting the patches applied in the AM. She had noted her pain this evening at a 7/10.

## 2011-07-06 NOTE — Progress Notes (Signed)
Patient ID: Jillian Estrada, female   DOB: 10-23-1955, 56 y.o.   MRN: [id number] The patient has a depressed mood and affect. She participated in evening group and is interacting in the milieu. Stated that she is sleeping better and is not sure she needs to continue her medication at bed time. Denied any suicidal ideation.

## 2011-07-06 NOTE — Progress Notes (Signed)
Pt is resting in bed with eyes closed. RR WNL, even and unlabored. Level III obs in place. Pt is safe. Lawrence Marseilles

## 2011-07-06 NOTE — Progress Notes (Signed)
Pt found in bathroom during 0515 check with explosive diarrhea. Pt assisted to shower, linens changed. Pt complaining of flu like symptoms however is afebrile. Manual BP was 62/41 at 0545. Pt weak, dizzy, shaky. States she is often dehydrated. Gatorade pitcher provided and encouraged. On recheck at 0610 BP was 70/52. More fluids pushed with fall precautions reviewed. Rechecked at 0640 and BP 90/60. Pt's color improved. No further diarrhea. Encouraged to rest in bed and continue with fluids. Pt compliant. Lawrence Marseilles

## 2011-07-06 NOTE — Progress Notes (Signed)
[facility] Group Notes:  (Counselor/Nursing/MHT/Case Management/Adjunct) 1:15pm   Type of Therapy:  Group Therapy  Participation Level:  Did Not Attend  Jillian Estrada 07/06/2011  4:10 PM

## 2011-07-07 LAB — CLOSTRIDIUM DIFFICILE BY PCR: Toxigenic C. Difficile by PCR: NEGATIVE

## 2011-07-07 MED ORDER — AMITRIPTYLINE HCL 25 MG PO TABS: ORAL_TABLET | ORAL | Status: DC

## 2011-07-07 MED ORDER — LISINOPRIL-HYDROCHLOROTHIAZIDE 10-12.5 MG PO TABS: 1.0000 | ORAL_TABLET | Freq: Every day | ORAL | Status: DC

## 2011-07-07 MED ORDER — CYCLOBENZAPRINE HCL 10 MG PO TABS: 10.0000 mg | ORAL_TABLET | Freq: Three times a day (TID) | ORAL | Status: AC | PRN

## 2011-07-07 MED ORDER — VITAMIN D 50 MCG (2000 UT) PO CAPS: 1.0000 | ORAL_CAPSULE | Freq: Every morning | ORAL | Status: AC

## 2011-07-07 MED ORDER — LANSOPRAZOLE 30 MG PO TBDP: 30.0000 mg | ORAL_TABLET | Freq: Every day | ORAL | Status: DC

## 2011-07-07 MED ORDER — GABAPENTIN 300 MG PO CAPS: 600.0000 mg | ORAL_CAPSULE | Freq: Three times a day (TID) | ORAL | Status: DC

## 2011-07-07 MED ORDER — ADULT MULTIVITAMIN W/MINERALS CH: 1.0000 | ORAL_TABLET | Freq: Every day | ORAL | Status: DC

## 2011-07-07 MED ORDER — LIDOCAINE 5 % EX PTCH: 2.0000 | MEDICATED_PATCH | Freq: Every day | CUTANEOUS | Status: AC

## 2011-07-07 MED ORDER — TRAZODONE HCL 50 MG PO TABS: 50.0000 mg | ORAL_TABLET | Freq: Every evening | ORAL | Status: DC | PRN

## 2011-07-07 MED ORDER — ARIPIPRAZOLE 5 MG PO TABS: 5.0000 mg | ORAL_TABLET | ORAL | Status: DC

## 2011-07-07 MED ORDER — BUPROPION HCL ER (XL) 150 MG PO TB24: 150.0000 mg | ORAL_TABLET | Freq: Every morning | ORAL | Status: DC

## 2011-07-07 MED ORDER — ASPIRIN EC 81 MG PO TBEC: 81.0000 mg | DELAYED_RELEASE_TABLET | Freq: Every day | ORAL | Status: AC

## 2011-07-07 MED ORDER — DOCUSATE SODIUM 100 MG PO CAPS: 200.0000 mg | ORAL_CAPSULE | Freq: Every morning | ORAL | Status: DC

## 2011-07-07 MED ORDER — CALCIUM CARBONATE-VITAMIN D 600-400 MG-UNIT PO TABS: 1.0000 | ORAL_TABLET | Freq: Two times a day (BID) | ORAL | Status: DC

## 2011-07-07 NOTE — Progress Notes (Signed)
Patient ID: Jillian Estrada, female   DOB: [birth year], 56 y.o.   MRN: [id number]  Patient was pleasant and cooperative during the assessment. Had been asleep thru all of the 1st part of the shift.  Pt was elated that she felt better upon awakening than she did during the day. Support and encouragement was offered.

## 2011-07-07 NOTE — Progress Notes (Signed)
Grief and loss group 10-11am   Facilitated grief and loss group on [address]. Began group by establishing group rules (privacy/confidentiality, respect for members) and discussing ways in which one might encounter loss. Also highlighted grief reactions to loss and how to recognize/overcome them. This group was highly supportive and engaged w/ one another w/ open sharing and mutual support.   Pt was engaged in group. Pt related to other group members, offered support, and empathized with others who reported their struggles to overcome grief. Pt listened intently os another group member shared his process of overcoming grief and finding things daily to live for.   Rawson Minix B MS, LPCA, NCC

## 2011-07-07 NOTE — Progress Notes (Signed)
[facility] Group Notes: (Counselor/Nursing/MHT/Case Management/Adjunct) 07/07/2011   @  11:00am   Type of Therapy:  Group Therapy  Participation Level:  Limited  Participation Quality:  Attentive, Appropriate, Sharing  Affect:  Anxious  Cognitive:  Appropriate  Insight:  Limited  Engagement in Group: Limited  Engagement in Therapy:  Limited  Modes of Intervention:  Support and Exploration  Summary of Progress/Problems: Jillian Estrada came to group late and did not engage until near the end. She then explored her fears that she will relapse into depression once she returns home. She stated she cannot comprehend why she would not become depressed again as her situation has not changed. Other group members pointed out to [name] that she has changed, and encouraged her to engage in activities that support the positive changes she has made.   [name] 07/07/2011 2:13 PM

## 2011-07-07 NOTE — Tx Team (Signed)
Interdisciplinary Treatment Plan Update (Adult)  Date:  07/07/2011  Time Reviewed:  10:41 AM   Progress in Treatment: Attending groups: Yes Participating in groups:  Yes Taking medication as prescribed: Yes Tolerating medication:  Yes Family/Significant othe contact made: Yes  Patient understands diagnosis:  Yes Discussing patient identified problems/goals with staff:  Yes Medical problems stabilized or resolved:  Yes Denies suicidal/homicidal ideation: Yes Issues/concerns per patient self-inventory:  None identified Other: N/A  New problem(s) identified: None Identified  Reason for Continuation of Hospitalization: Stable to d/c  Interventions implemented related to continuation of hospitalization: Stable to d/c  Additional comments: N/A  Estimated length of stay: D/C today  Discharge Plan: Pt will follow up at [facility] for medication management and a therapist in [place], [place]  [place] goal(s): N/A  Review of initial/current patient goals per problem list:    1.  Goal(s): Reduce depressive symptoms  Met:  No  Target date: by discharge  As evidenced by: Reducing depression from a 10 to a 3 as reported by pt. Pt ranks at a 4-5 today but reports feeling stable to d/c today.  2.  Goal (s): Reduce/Eliminate suicidal ideation  Met:  Yes  Target date: by discharge  As evidenced by: pt denies SI  3.  Goal(s): Reduce anxiety symptoms  Met:  Yes  Target date: by discharge  As evidenced by: Reduce anxiety from a 10 to a 3 as reported by pt.  Pt denies anxiety.   Attendees: Patient:  Jillian Estrada 07/07/2011 10:43 AM   Family:     Physician:  Orson Aloe, MD  07/07/2011  10:41 AM   Nursing:   Quintella Reichert, RN 07/07/2011 10:43 AM   Case Manager:  Reyes Ivan, LCSWA 07/07/2011  10:41 AM   Counselor:  Angus Palms, LCSW 07/07/2011  10:41 AM   Other:  Juline Patch, LCSW 07/07/2011  10:41 AM   Other:  07/07/2011  10:41 AM   Other:     Other:      Scribe for  Treatment Team:   Carmina Miller, 07/07/2011 , 10:41 AM

## 2011-07-07 NOTE — Discharge Summary (Signed)
Physician Discharge Summary Note  Patient:  Jillian Estrada is an 56 y.o., female MRN:  [id number] DOB:  1955-11-12 Patient phone:  [phone] (home)  Patient address:   [address] [place] [address],   Date of Admission:  07/02/2011 Date of Discharge: 07/07/11  Reason for Admission: Increased signs and symptoms of depression.  Discharge Diagnoses: Active Problems:  Major depressive disorder, recurrent episode, severe   Axis Diagnosis:   AXIS I:  Major depressive disorder, recurrent episodes severe AXIS II:  Deferred AXIS III:   Past Medical History  Diagnosis Date  . Depressed   . Hypertension    AXIS IV:  other psychosocial or environmental problems AXIS V:  70  Level of Care:  OP  Hospital Course: This is a 56 year old Caucasian female, [facility] from [facility] ED with complaints of increased depression. Patient reports, "I have been severely depressed since may of 2012. I have a lot of health issues, that is the reason for my depression. I have had 2 back surgeries back to back of each other. Since then, I have severe back pains. I was asked to retire from my job of 35 years because of my health issues. I retired from my job in October 2012. Since then, I don't know what to do with my life. I loved my job and I was good at it. Right now, I am very confused about what to do and what I should be doing with my life. I feel very lost. Everything seem so overwhelming to me. I can't get hold of myself. While a patient in this hospital, Ms. Solimine received medication management as well as group counseling. She also received medication management for her other medical conditions. Some adjustment were made in her medications as well. Patient is no longer on Xanax and or Oxycodone as these 2 medications may be related to patient's clouded memories and inability to think straight. She is encouraged to get out of her house on daily basis and find some  hobby to help eliminate the feelings of boredom. She will continue psychiatric care on an outpatient at the [facility] in [place] with Dr. Tomasa Rand and counseling with the psychological services in [place]. She is provided with the addresses, dates and times for her appoint.  Patient is currently being discharged to her home with husband via family transport. She left [facility] with all personal belongings in no apparent distress What pt has learned from hospital stay is that the meds are different and help better. She can think more clearly off the Xanax and the Oxycontin. Hearing that others have had similar struggles and that she is not alone has helped her.   Consults:  None  Significant Diagnostic Studies:  None  Discharge Vitals:   Blood pressure 126/86, pulse 112, temperature 98.1 F (36.7 C), temperature source Oral, resp. rate 14, height 5\' 4"  (1.626 m), weight 61.236 kg (135 lb).  Mental Status Exam: See Mental Status Examination and Suicide Risk Assessment completed by Attending Physician prior to discharge.  Discharge destination:  Home  Is patient on multiple antipsychotic therapies at discharge:  No   Has Patient had three or more failed trials of antipsychotic monotherapy by history:  No  Recommended Plan for Multiple Antipsychotic Therapies: NA   Medication List  As of 07/07/2011  1:05 PM   STOP taking these medications         ALPRAZolam 1 MG tablet  CALCIUM 600 + D PO      oxyCODONE 15 MG immediate release tablet      VIIBRYD 40 MG Tabs      zolpidem 12.5 MG CR tablet         TAKE these medications      Indication    amitriptyline 25 MG tablet   Commonly known as: ELAVIL   Take by mouth 1 to 2 at night for pain management.       ARIPiprazole 5 MG tablet   Commonly known as: ABILIFY   Take 1 tablet (5 mg total) by mouth every morning. For depresion.       aspirin EC 81 MG tablet   Take 1 tablet (81 mg total) by mouth daily. For prevention  of stroke and heart attack       buPROPion 150 MG 24 hr tablet   Commonly known as: WELLBUTRIN XL   Take 1 tablet (150 mg total) by mouth every morning. For depresion.       Calcium Carbonate-Vitamin D 600-400 MG-UNIT per tablet   Take 1 tablet by mouth 2 (two) times daily. For Vitamin D replacement and mood control.       cyclobenzaprine 10 MG tablet   Commonly known as: FLEXERIL   Take 1 tablet (10 mg total) by mouth 3 (three) times daily as needed. For pain       docusate sodium 100 MG capsule   Commonly known as: COLACE   Take 2 capsules (200 mg total) by mouth every morning. For constipation       gabapentin 300 MG capsule   Commonly known as: NEURONTIN   Take 2 capsules (600 mg total) by mouth 3 (three) times daily. For anxiety and management of pain.  May be helpful for sleep.       lansoprazole 30 MG disintegrating tablet   Commonly known as: PREVACID SOLUTAB   Take 1 tablet (30 mg total) by mouth daily. For control of stomach acid secretion and helps GERD.       lidocaine 5 %   Commonly known as: LIDODERM   Place 2 patches onto the skin daily. Remove & Discard patch within 12 hours or as directed by MD. For Pain Management       lisinopril-hydrochlorothiazide 10-12.5 MG per tablet   Commonly known as: PRINZIDE,ZESTORETIC   Take 1 tablet by mouth daily. For control of high blood pressure       mulitivitamin with minerals Tabs   Take 1 tablet by mouth daily. For nutritional supplementation.       traZODone 50 MG tablet   Commonly known as: DESYREL   Take 1 tablet (50 mg total) by mouth at bedtime and may repeat dose one time if needed. For insomnia.       Vitamin D 2000 UNITS Caps   Take 1 capsule (2,000 Units total) by mouth every morning. For Vitamin D replacement and mood control.            Follow-up Information    Follow up with [facility] - Dr. Tomasa Rand on 07/16/2011. (Appointment scheduled at 11:00 am)    Contact information:   [address]  [place], [place] [address] [phone]      [facility] - [facility] (therapy) on 07/09/2011. (Appointment scheduled at 1:00 pm)    Contact information:   [address] [place], [place] [address] Phone: [phone] Fax: [phone]  Follow up with [facility] - Dr. Jennelle Human on 08/11/2011. (New patient appointment - Appt scheduled at  1:00 pm)    Contact information:   [address] [place], [place] [address] [phone]         Follow-up recommendations:  Other:  Keep all scheduled follow-up appointments as scheduled.                                                                  Activities as tolerated.  Comments:  Take all discharge medications as prescribed.                        Report any adverse effects of medications to your outpatient provider promptly.  Signed: Armandina Stammer I 07/07/2011, 1:05 PM

## 2011-07-07 NOTE — Progress Notes (Signed)
[facility] Case Management Discharge Plan:  Will you be returning to the same living situation after discharge: Yes,  return home At discharge, do you have transportation home?:Yes,  pt's husband to transport pt Do you have the ability to pay for your medications:Yes,  access to meds  Release of information consent forms completed and in the chart;  Patient's signature needed at discharge.  Patient to Follow up at:  Follow-up Information    Follow up with [facility] - Dr. Tomasa Rand on 07/16/2011. (Appointment scheduled at 11:00 am)    Contact information:   [address] [place], [place] [address] [phone]      [facility] with [facility] - [facility] (therapy) on 07/09/2011. (Appointment scheduled at 1:00 pm)    Contact information:   [address] [place], [place] [address] Phone: [phone] Fax: [phone]      Follow up with [facility] - Dr. Jennelle Human on 08/11/2011. (New patient appointment - Appt scheduled at  1:00 pm)    Contact information:   [address] [place], [place] [address] [phone]         Patient denies SI/HI:   Yes,  pt denies SI/HI    Safety Planning and Suicide Prevention discussed:  Yes,  discussed with pt  Barrier to discharge identified:No.  Summary and Recommendations: Pt attended discharge planning group and actively participated.  Pt presents with calm mood and affect.  Pt reports feeling stable to d/c today.  Pt ranks depression at a 4 and denies anxiety and SI.  Pt initially refused to go back to Dr. Tomasa Rand for follow up for medication management.  SW explained that pt has to be the one to terminate services with her provider and can then switch to Dr. Jennelle Human.  Pt will return home with her husband.  No recommendations from SW.  No further needs voiced by pt.  Pt stable to discharge.     Carmina Miller 07/07/2011, 11:56 AM

## 2011-07-07 NOTE — [facility] Suicide Risk Assessment (Signed)
Suicide Risk Assessment  Discharge Assessment     Demographic factors:  Assessment Details Time of Assessment: Admission Current Mental Status:    Risk Reduction Factors:  Risk Reduction Factors: Sense of responsibility to family;Positive social support  CLINICAL FACTORS:   Severe Anxiety and/or Agitation Depression:   Anhedonia Comorbid alcohol abuse/dependence Hopelessness Alcohol/Substance Abuse/Dependencies Chronic Pain Previous Psychiatric Diagnoses and Treatments Medical Diagnoses and Treatments/Surgeries  COGNITIVE FEATURES THAT CONTRIBUTE TO RISK:  Thought constriction (tunnel vision)    SUICIDE RISK:   Minimal: No identifiable suicidal ideation.  Patients presenting with no risk factors but with morbid ruminations; may be classified as minimal risk based on the severity of the depressive symptoms  ADL's:  Intact  Sleep: Good  Appetite:  Good  Suicidal Ideation:  Denies adamantly any suicidal thoughts. Homicidal Ideation:  Denies adamantly any homicidal thoughts.  Mental Status Examination/Evaluation: Objective:  Appearance: Casual  Eye Contact::  Good  Speech:  Clear and Coherent  Volume:  Normal  Mood:  Euthymic  Affect:  Congruent  Thought Process:  Coherent  Orientation:  Full  Thought Content:  WDL  Suicidal Thoughts:  No  Homicidal Thoughts:  No  Memory:  Immediate;   Good  Judgement:  Good  Insight:  Good  Psychomotor Activity:  Normal  Concentration:  Good  Recall:  Good  Akathisia:  No  AIMS (if indicated):     Assets:  Communication Skills Desire for Improvement Financial Resources/Insurance Housing Intimacy Leisure Time Resilience Social Support Talents/Skills Transportation  Sleep: Number of Hours: 5.5    Vital Signs: Blood pressure 126/86, pulse 112, temperature 98.1 F (36.7 C), temperature source Oral, resp. rate 14, height 5\' 4"  (1.626 m), weight 61.236 kg (135 lb).  Labs Results for orders placed during the hospital  encounter of 07/02/11 (from the past 48 hour(s))  CLOSTRIDIUM DIFFICILE BY PCR     Status: Normal   Collection Time   07/06/11  8:55 PM      Component Value Range Comment   C difficile by pcr NEGATIVE  NEGATIVE      What pt has learned from hospital stay is that the meds are different and help better.  She can think more clearly off the Xanax and the Oxycontin.  Hearing that others have had similar struggles and that she is not alone has helped her.  Risk of self harm is minimal in that she has never thought of suicide, but she came in here almost thinking about committing suicide.  She is now off the opiates and her pain and depression are better.  She has her life with her husband to live for.  Risk of harm to others is minimal in that she has not been involved in fights or had any legal charges filed on her.   PLAN: Discharge home Continue Medication List  As of 07/07/2011 11:23 AM   STOP taking these medications         ALPRAZolam 1 MG tablet      CALCIUM 600 + D PO      oxyCODONE 15 MG immediate release tablet      VIIBRYD 40 MG Tabs      zolpidem 12.5 MG CR tablet         TAKE these medications         amitriptyline 25 MG tablet   Commonly known as: ELAVIL   Take by mouth 1 to 2 at night for pain management.      ARIPiprazole 5 MG  tablet   Commonly known as: ABILIFY   Take 1 tablet (5 mg total) by mouth every morning. For depresion.      aspirin EC 81 MG tablet   Take 1 tablet (81 mg total) by mouth daily. For prevention of stroke and heart attack      buPROPion 150 MG 24 hr tablet   Commonly known as: WELLBUTRIN XL   Take 1 tablet (150 mg total) by mouth every morning. For depresion.      Calcium Carbonate-Vitamin D 600-400 MG-UNIT per tablet   Take 1 tablet by mouth 2 (two) times daily. For Vitamin D replacement and mood control.      cyclobenzaprine 10 MG tablet   Commonly known as: FLEXERIL   Take 1 tablet (10 mg total) by mouth 3 (three) times daily as  needed. For pain      docusate sodium 100 MG capsule   Commonly known as: COLACE   Take 2 capsules (200 mg total) by mouth every morning. For constipation      gabapentin 300 MG capsule   Commonly known as: NEURONTIN   Take 2 capsules (600 mg total) by mouth 3 (three) times daily. For anxiety and management of pain.  May be helpful for sleep.      lansoprazole 30 MG disintegrating tablet   Commonly known as: PREVACID SOLUTAB   Take 1 tablet (30 mg total) by mouth daily. For control of stomach acid secretion and helps GERD.      lidocaine 5 %   Commonly known as: LIDODERM   Place 2 patches onto the skin daily. Remove & Discard patch within 12 hours or as directed by MD. For Pain Management      lisinopril-hydrochlorothiazide 10-12.5 MG per tablet   Commonly known as: PRINZIDE,ZESTORETIC   Take 1 tablet by mouth daily. For control of high blood pressure      mulitivitamin with minerals Tabs   Take 1 tablet by mouth daily. For nutritional supplementation.      traZODone 50 MG tablet   Commonly known as: DESYREL   Take 1 tablet (50 mg total) by mouth at bedtime and may repeat dose one time if needed. For insomnia.      Vitamin D 2000 UNITS Caps   Take 1 capsule (2,000 Units total) by mouth every morning. For Vitamin D replacement and mood control.           Jillian Estrada 07/07/2011, 11:23 AM

## 2011-07-07 NOTE — Progress Notes (Signed)
Discharge Note:   Patient discharged to her home with husband.   Patient denied SI and HI.   Denied A/V hallucinations.   Denied pain.   Suicide prevention information given to patient, discussed with patient, who stated she understood and had no questions.   All discharge instructions reviewed with patient who stated she understood and had no questions.   Patient received all her belongings, miscellaneous items, prescriptions before leaving [facility].  Patient stated she appreciated all assistance she received from staff while at [facility].  Patient has been cooperative, pleasant, and alert today.

## 2011-07-07 NOTE — Progress Notes (Signed)
Patient's self inventory sheet, she sleeps well, has improving appetite, low energy level, good attention span.   Rated depression and hopelessness #4.  Denied SI.  Worst pain #5.   Does not know how to take better care of himself after discharge.  No problems taking meds after discharge. Last BM yesterday.   Did not take colace this morning because of numerous BM's yesterday.

## 2011-07-08 NOTE — Discharge Summary (Signed)
I agree with this D/C Summary.  

## 2011-07-10 NOTE — Progress Notes (Signed)
Patient Discharge Instructions:  Psychiatric Admission Assessment Note Faxed,  07/10/2011 Discharge Summary Note Faxed,   07/10/2011 After Visit Summary (AVS) Faxed,  07/10/2011 Face Sheet Faxed, 07/10/2011 Faxed to the Next [facility] Care provider:  07/10/2011  Faxed to [facility] @ [phone] And to [facility] - Dr. Tomasa Rand, Dr. Jennelle Human @ [phone]  Heloise Purpura Eduard Clos, 07/10/2011, 12:15 PM

## 2014-12-30 LAB — COLOGUARD: COLOGUARD: NEGATIVE

## 2015-08-21 ENCOUNTER — Ambulatory Visit (INDEPENDENT_AMBULATORY_CARE_PROVIDER_SITE_OTHER): Payer: Medicare Other | Admitting: Gastroenterology

## 2015-08-21 ENCOUNTER — Other Ambulatory Visit (HOSPITAL_COMMUNITY)
Admission: RE | Admit: 2015-08-21 | Discharge: 2015-08-21 | Disposition: A | Discharge status: Home / Self Care | Payer: Medicare Other | Source: Ambulatory Visit | Attending: Gastroenterology | Admitting: Gastroenterology

## 2015-08-21 ENCOUNTER — Encounter (HOSPITAL_COMMUNITY): Payer: Self-pay | Admitting: Oncology

## 2015-08-21 ENCOUNTER — Ambulatory Visit (HOSPITAL_COMMUNITY)
Admission: RE | Admit: 2015-08-21 | Discharge: 2015-08-21 | Disposition: A | Discharge status: Home / Self Care | Payer: Medicare Other | Source: Ambulatory Visit | Attending: Gastroenterology | Admitting: Gastroenterology

## 2015-08-21 ENCOUNTER — Other Ambulatory Visit: Payer: Self-pay

## 2015-08-21 ENCOUNTER — Encounter (HOSPITAL_COMMUNITY)
Admission: RE | Admit: 2015-08-21 | Discharge: 2015-08-21 | Disposition: A | Discharge status: Home / Self Care | Payer: Medicare Other | Source: Ambulatory Visit | Attending: Gastroenterology | Admitting: Gastroenterology

## 2015-08-21 ENCOUNTER — Encounter (HOSPITAL_COMMUNITY): Payer: Self-pay

## 2015-08-21 ENCOUNTER — Encounter: Payer: Self-pay | Admitting: Gastroenterology

## 2015-08-21 VITALS — BP 148/95 | HR 82 | Temp 97.6°F | Ht 64.0 in | Wt 149.0 lb

## 2015-08-21 DIAGNOSIS — C210 Malignant neoplasm of anus, unspecified: Secondary | ICD-10-CM

## 2015-08-21 HISTORY — DX: Malignant neoplasm of anus, unspecified: C21.0

## 2015-08-21 LAB — BASIC METABOLIC PANEL
Anion gap: 7 (ref 5–15)
BUN: 16 mg/dL (ref 6–20)
CALCIUM: 9.6 mg/dL (ref 8.9–10.3)
CO2: 27 mmol/L (ref 22–32)
CREATININE: 0.8 mg/dL (ref 0.44–1.00)
Chloride: 104 mmol/L (ref 101–111)
GFR calc non Af Amer: >60 mL/min (ref >60)
Glucose, Bld: 89 mg/dL (ref 65–99)
Potassium: 5.2 mmol/L — ABNORMAL HIGH (ref 3.5–5.1)
SODIUM: 138 mmol/L (ref 135–145)

## 2015-08-21 LAB — CBC
HCT: 42.8 % (ref 36.0–46.0)
Hemoglobin: 14.7 g/dL (ref 12.0–15.0)
MCH: 31.5 pg (ref 26.0–34.0)
MCHC: 34.3 g/dL (ref 30.0–36.0)
MCV: 91.6 fL (ref 78.0–100.0)
PLATELETS: 255 10*3/uL (ref 150–400)
RBC: 4.67 MIL/uL (ref 3.87–5.11)
RDW: 13 % (ref 11.5–15.5)
WBC: 11.8 10*3/uL — AB (ref 4.0–10.5)

## 2015-08-21 MED ORDER — IOPAMIDOL (ISOVUE-300) INJECTION 61%
100.0000 mL | Freq: Once | INTRAVENOUS | Status: AC | PRN
  Administered 2015-08-21: 100 mL via INTRAVENOUS

## 2015-08-21 MED ORDER — DIATRIZOATE MEGLUMINE & SODIUM 66-10 % PO SOLN
ORAL | Status: AC
  Filled 2015-08-21: qty 30

## 2015-08-21 NOTE — Patient Instructions (Signed)
I have ordered a CT of your abdomen and chest today for staging purposes.   We have referred you to Dr. Whitney Muse, the oncologist here in [place].  We have scheduled you for a colonoscopy with Dr. Oneida Alar as soon as possible.  Further recommendations to follow!

## 2015-08-21 NOTE — Progress Notes (Signed)
Primary Care Physician:  Tobe Sos, MD Primary Gastroenterologist:  Dr. Oneida Alar   Chief Complaint  Patient presents with  . set up TCS    HPI:   Jillian Estrada is a 61 y.o. female presenting today at the request of her PCP, seen urgently due to newly diagnosed squamous cell anal carcinoma, s/p excision and biopsy by surgery Dr. [name] Ching in [place] on 08/15/15. I do not have actual procedure notes, but path states "invasive squamous cell carcinoma with involvement of multiple margins".   Originally, she saw a Psychologist, sport and exercise in [place] and was told she had a thrombosed hemorrhoid. Saw a gynecologist a week before last when she was not getting better. Felt it was infected. Set up with a surgeon and had surgery last Thursday (Dr. Thereasa Solo). Was told she had cancer on Monday. Will need chemo/radiation. She has not been referred to an oncologist. It appears she will be referred to Dr. Arnoldo Morale here in [place] for further surgical needs. Her last colonoscopy was in 2010 by Dr. Rozetta Nunnery with internal hemorrhoids, otherwise normal. She believes this was normal. She presents now for a complete colonoscopy prior to seeing oncology and surgeon.    Treated for Hep C through [facility], completed [place]. She is genotype 1b. Last seen at [facility] Jan 2017 . Jan 2017 HCV RNA not detected. She will follow-up with them for routine care.   Past Medical History  Diagnosis Date  . Depression     ?Bipolar, sees psychiatrist, Sees Dr. Riley Churches in [place]. Hospitalized a few times, Feb 2013 and Aug 2015.   Marland Kitchen Hypertension   . Anal cancer ([place])   . Kidney failure     Lithium overdose unintentional, now resolved  . Hepatitis C     treated through [facility]  . Primary anal squamous cell carcinoma ([place]) 08/21/2015    Past Surgical History  Procedure Laterality Date  . Back surgery      Nov 2008, Aug 2011  . Small bowel repair      June 2010. Injury to small bowel during ovarian cyst  surgery  . Foreign object removal      Aug 2010, surgical tool left during ovarian surgery  . Colonoscopy  2010    [place]: non-bleeding internal hemorrhoids   . Rectal biopsy      Current Outpatient Prescriptions  Medication Sig Dispense Refill  . alendronate (FOSAMAX) 70 MG tablet Take 70 mg by mouth once a week. Take with a full glass of water on an empty stomach.    Marland Kitchen allopurinol (ZYLOPRIM) 100 MG tablet Take 100 mg by mouth daily.    . ARIPiprazole (ABILIFY) 15 MG tablet Take 15 mg by mouth daily.    Marland Kitchen aspirin EC 81 MG tablet Take 1 tablet (81 mg total) by mouth daily. For prevention of stroke and heart attack 1 tablet 0  . Biotin 5000 MCG CAPS Take 1 capsule by mouth 3 (three) times daily.     . cetirizine (ZYRTEC) 10 MG tablet Take 10 mg by mouth daily.    . Cholecalciferol (VITAMIN D) 2000 UNITS CAPS Take 1 capsule (2,000 Units total) by mouth every morning. For Vitamin D replacement and mood control. 30 capsule 0  . clonazePAM (KLONOPIN) 0.5 MG tablet Take 0.5 mg by mouth 2 (two) times daily as needed for anxiety.    . cyclobenzaprine (FLEXERIL) 10 MG tablet Take 1 tablet (10 mg total) by mouth 3 (three) times daily as  needed. For pain 30 tablet 0  . docusate sodium (COLACE) 100 MG capsule Take 2 capsules (200 mg total) by mouth every morning. For constipation 10 capsule 0  . metoprolol tartrate (LOPRESSOR) 25 MG tablet Take 25 mg by mouth 2 (two) times daily.    Marland Kitchen oxycodone (OXY-IR) 5 MG capsule Take 5 mg by mouth every 4 (four) hours as needed.    . sertraline (ZOLOFT) 100 MG tablet Take 150 mg by mouth daily.    Marland Kitchen tolterodine (DETROL) 2 MG tablet Take 2 mg by mouth as needed.    . venlafaxine XR (EFFEXOR-XR) 150 MG 24 hr capsule Take 150 mg by mouth daily with breakfast.    . vitamin B-12 (CYANOCOBALAMIN) 1000 MCG tablet Take 1,000 mcg by mouth daily.    . Calcium Carbonate-Vitamin D (CALCIUM 600+D) 600-400 MG-UNIT per tablet Take 1 tablet by mouth 2 (two) times daily. For  Vitamin D replacement and mood control. 60 tablet 0  . gabapentin (NEURONTIN) 300 MG capsule Take 2 capsules (600 mg total) by mouth 3 (three) times daily. For anxiety and management of pain.  May be helpful for sleep. 90 capsule 0  . lansoprazole (PREVACID SOLUTAB) 30 MG disintegrating tablet Take 1 tablet (30 mg total) by mouth daily. For control of stomach acid secretion and helps GERD. 30 tablet 0   No current facility-administered medications for this visit.    Allergies as of 08/21/2015 - Review Complete 08/21/2015  Allergen Reaction Noted  . Penicillins Hives 06/30/2011  . Codeine Nausea Only 06/30/2011    Family History  Problem Relation Age of Onset  . Colon polyps Father   . Colon cancer Neg Hx     Social History   Social History  . Marital Status: Married    Spouse Name: N/A  . Number of Children: N/A  . Years of Education: N/A   Occupational History  . retired     worked 34 years for [employer] Topics  . Smoking status: Never Smoker   . Smokeless tobacco: Not on file  . Alcohol Use: 0.0 oz/week    0 Standard drinks or equivalent per week     Comment: occasionally  . Drug Use: No  . Sexual Activity: Not on file   Other Topics Concern  . Not on file   Social History Narrative    Review of Systems: Gen: Denies any fever, chills, fatigue, weight loss, lack of appetite.  CV: Denies chest pain, heart palpitations, peripheral edema, syncope.  Resp: Denies shortness of breath at rest or with exertion. Denies wheezing or cough.  GI: see HPI  GU : occasional urinary frequency  MS: back pain  Derm: Denies rash, itching, dry skin Psych: +depression/anxiety but controlled now with medications Heme: Denies bruising, bleeding, and enlarged lymph nodes.  Physical Exam: BP 148/95 mmHg  Pulse 82  Temp(Src) 97.6 F (36.4 C)  Ht 5\' 4"  (1.626 m)  Wt 149 lb (67.586 kg)  BMI 25.56 kg/m2 General:   Alert and oriented. Pleasant  and cooperative. Well-nourished and well-developed.  Head:  Normocephalic and atraumatic. Eyes:  Without icterus, sclera clear and conjunctiva pink.  Ears:  Normal auditory acuity. Nose:  No deformity, discharge,  or lesions. Mouth:  No deformity or lesions, oral mucosa pink.  Lungs:  Clear to auscultation bilaterally. No wheezes, rales, or rhonchi. No distress.  Heart:  S1, S2 present without murmurs appreciated.  Abdomen:  +BS, soft, non-tender and non-distended. No  HSM noted. No guarding or rebound. No masses appreciated.  Rectal:  Patient declined internal exam. External exam reveals no obstructing lesions. Area of excision without any purulence, erythema, edema. Healing well.  Msk:  Symmetrical without gross deformities. Normal posture. Extremities:  Without  edema. Neurologic:  Alert and  oriented x4;  grossly normal neurologically. Psych:  Alert and cooperative. Normal mood and affect.

## 2015-08-21 NOTE — Patient Instructions (Addendum)
   Your procedure is scheduled on: 08/27/2015  Report to [facility] at 7:45    AM.  Call this number if you have problems the morning of surgery: [phone]   Remember:   Do not drink or eat food:After Midnight.  :  Take these medicines the morning of surgery with A SIP OF WATER: Abilify, Zyrtec, Klonopin, Prevacid, Metoprolol, and Zoloft   Do not wear jewelry, make-up or nail polish.  Do not wear lotions, powders, or perfumes. You may wear deodorant.  Do not shave 48 hours prior to surgery. Men may shave face and neck.  Do not bring valuables to the hospital.  Contacts, dentures or bridgework may not be worn into surgery.  Leave suitcase in the car. After surgery it may be brought to your room.  For patients admitted to the hospital, checkout time is 11:00 AM the day of discharge.   Patients discharged the day of surgery will not be allowed to drive home.   Colonoscopy, Care After Refer to this sheet in the next few weeks. These instructions provide you with information on caring for yourself after your procedure. Your health care provider may also give you more specific instructions. Your treatment has been planned according to current medical practices, but problems sometimes occur. Call your health care provider if you have any problems or questions after your procedure. WHAT TO EXPECT AFTER THE PROCEDURE  After your procedure, it is typical to have the following:  A small amount of blood in your stool.  Moderate amounts of gas and mild abdominal cramping or bloating. HOME CARE INSTRUCTIONS  Do not drive, operate machinery, or sign important documents for 24 hours.  You may shower and resume your regular physical activities, but move at a slower pace for the first 24 hours.  Take frequent rest periods for the first 24 hours.  Walk around or put a warm pack on your abdomen to help reduce abdominal cramping and bloating.  Drink enough fluids to keep your urine clear or pale  yellow.  You may resume your normal diet as instructed by your health care provider. Avoid heavy or fried foods that are hard to digest.  Avoid drinking alcohol for 24 hours or as instructed by your health care provider.  Only take over-the-counter or prescription medicines as directed by your health care provider.  If a tissue sample (biopsy) was taken during your procedure:  Do not take aspirin or blood thinners for 7 days, or as instructed by your health care provider.  Do not drink alcohol for 7 days, or as instructed by your health care provider.  Eat soft foods for the first 24 hours. SEEK MEDICAL CARE IF: You have persistent spotting of blood in your stool 2-3 days after the procedure. SEEK IMMEDIATE MEDICAL CARE IF:  You have more than a small spotting of blood in your stool.  You pass large blood clots in your stool.  Your abdomen is swollen (distended).  You have nausea or vomiting.  You have a fever.  You have increasing abdominal pain that is not relieved with medicine.   This information is not intended to replace advice given to you by your health care provider. Make sure you discuss any questions you have with your health care provider.   Document Released: 12/10/2003 Document Revised: 02/15/2013 Document Reviewed: 01/02/2013 Elsevier Interactive Patient Education [employer].

## 2015-08-22 ENCOUNTER — Encounter: Payer: Self-pay | Admitting: Gastroenterology

## 2015-08-22 ENCOUNTER — Encounter (HOSPITAL_COMMUNITY)
Admission: RE | Admit: 2015-08-22 | Discharge: 2015-08-22 | Disposition: A | Discharge status: Home / Self Care | Payer: Medicare Other | Source: Ambulatory Visit | Attending: Gastroenterology | Admitting: Gastroenterology

## 2015-08-22 ENCOUNTER — Telehealth: Payer: Self-pay | Admitting: Gastroenterology

## 2015-08-22 NOTE — Telephone Encounter (Signed)
Pt was seen yesterday and she had a CT of chest and abdomen. Someone in radiology told her that we would have her results within the hour. I don't know if this was ordered STAT or not. I transferred call to DS VM.

## 2015-08-22 NOTE — Telephone Encounter (Signed)
I called pt and told her I had not received anything from [facility]. But I did tell her that the report said no acute findings. She is aware we are closed tomorrow and will be back in the office on Monday and Vicente Males will let her know more.

## 2015-08-22 NOTE — Assessment & Plan Note (Addendum)
60 year old female referred to our practice for complete colonoscopy prior to treatment for newly diagnosed invasive squamous cell anal carcinoma, s/p excision 08/15/15 by Dr. Thereasa Solo in [place]. Last colonoscopy in 2010 by Dr. Rozetta Nunnery with internal hemorrhoids, otherwise normal. She will need to establish care with an oncologist and will also be set up with Dr. Arnoldo Morale in the future here in [place] per patient. I have elected to go ahead and refer her to Dr. Whitney Muse. We will also pursue CT abd/pelvis/chest for staging purposes. Colonoscopy now to be completed to evaluate for any other occult malignancy and evaluation of lower GI tract prior to treatment.   Proceed with colonoscopy with Dr. Oneida Alar in the near future. The risks, benefits, and alternatives have been discussed in detail with the patient. They state understanding and desire to proceed.  PROPOFOL due to polypharmacy

## 2015-08-25 ENCOUNTER — Encounter (HOSPITAL_COMMUNITY): Payer: Self-pay | Admitting: Emergency Medicine

## 2015-08-25 ENCOUNTER — Emergency Department (HOSPITAL_COMMUNITY)
Admission: EM | Admit: 2015-08-25 | Discharge: 2015-08-25 | Disposition: A | Discharge status: Home / Self Care | Payer: Medicare Other | Attending: Emergency Medicine | Admitting: Emergency Medicine

## 2015-08-25 ENCOUNTER — Emergency Department (HOSPITAL_COMMUNITY): Payer: Medicare Other

## 2015-08-25 DIAGNOSIS — Z79899 Other long term (current) drug therapy: Secondary | ICD-10-CM | POA: Diagnosis not present

## 2015-08-25 DIAGNOSIS — C210 Malignant neoplasm of anus, unspecified: Secondary | ICD-10-CM | POA: Insufficient documentation

## 2015-08-25 DIAGNOSIS — I10 Essential (primary) hypertension: Secondary | ICD-10-CM | POA: Diagnosis not present

## 2015-08-25 DIAGNOSIS — F329 Major depressive disorder, single episode, unspecified: Secondary | ICD-10-CM | POA: Diagnosis not present

## 2015-08-25 DIAGNOSIS — K6289 Other specified diseases of anus and rectum: Secondary | ICD-10-CM

## 2015-08-25 LAB — COMPREHENSIVE METABOLIC PANEL
ALBUMIN: 3.9 g/dL (ref 3.5–5.0)
ALT: 19 U/L (ref 14–54)
AST: 26 U/L (ref 15–41)
Alkaline Phosphatase: 62 U/L (ref 38–126)
Anion gap: 8 (ref 5–15)
BUN: 14 mg/dL (ref 6–20)
CHLORIDE: 107 mmol/L (ref 101–111)
CO2: 27 mmol/L (ref 22–32)
Calcium: 9.1 mg/dL (ref 8.9–10.3)
Creatinine, Ser: 0.86 mg/dL (ref 0.44–1.00)
GFR calc Af Amer: >60 mL/min (ref >60)
GFR calc non Af Amer: >60 mL/min (ref >60)
GLUCOSE: 98 mg/dL (ref 65–99)
POTASSIUM: 4.6 mmol/L (ref 3.5–5.1)
Sodium: 142 mmol/L (ref 135–145)
Total Bilirubin: 0.3 mg/dL (ref 0.3–1.2)
Total Protein: 7.5 g/dL (ref 6.5–8.1)

## 2015-08-25 LAB — CBC WITH DIFFERENTIAL/PLATELET
Basophils Absolute: 0 10*3/uL (ref 0.0–0.1)
Basophils Relative: 0 %
EOS PCT: 2 %
Eosinophils Absolute: 0.2 10*3/uL (ref 0.0–0.7)
HCT: 40.3 % (ref 36.0–46.0)
Hemoglobin: 13.7 g/dL (ref 12.0–15.0)
LYMPHS ABS: 2.4 10*3/uL (ref 0.7–4.0)
LYMPHS PCT: 26 %
MCH: 31.1 pg (ref 26.0–34.0)
MCHC: 34 g/dL (ref 30.0–36.0)
MCV: 91.4 fL (ref 78.0–100.0)
MONO ABS: 0.5 10*3/uL (ref 0.1–1.0)
MONOS PCT: 6 %
Neutro Abs: 6.2 10*3/uL (ref 1.7–7.7)
Neutrophils Relative %: 66 %
PLATELETS: 244 10*3/uL (ref 150–400)
RBC: 4.41 MIL/uL (ref 3.87–5.11)
RDW: 12.9 % (ref 11.5–15.5)
WBC: 9.3 10*3/uL (ref 4.0–10.5)

## 2015-08-25 LAB — URINALYSIS, ROUTINE W REFLEX MICROSCOPIC
Bilirubin Urine: NEGATIVE
Glucose, UA: NEGATIVE mg/dL
Hgb urine dipstick: NEGATIVE
Ketones, ur: NEGATIVE mg/dL
Nitrite: NEGATIVE
Protein, ur: NEGATIVE mg/dL
Specific Gravity, Urine: 1.01 (ref 1.005–1.030)
pH: 6.5 (ref 5.0–8.0)

## 2015-08-25 LAB — URINE MICROSCOPIC-ADD ON
Bacteria, UA: NONE SEEN
RBC / HPF: NONE SEEN RBC/hpf (ref 0–5)

## 2015-08-25 LAB — I-STAT CG4 LACTIC ACID, ED: Lactic Acid, Venous: 1.67 mmol/L (ref 0.5–2.0)

## 2015-08-25 MED ORDER — IOPAMIDOL (ISOVUE-300) INJECTION 61%
100.0000 mL | Freq: Once | INTRAVENOUS | Status: AC | PRN
  Administered 2015-08-25: 100 mL via INTRAVENOUS

## 2015-08-25 MED ORDER — OXYCODONE-ACETAMINOPHEN 5-325 MG PO TABS: ORAL_TABLET | ORAL | Status: DC

## 2015-08-25 MED ORDER — SODIUM CHLORIDE 0.9 % IV SOLN
INTRAVENOUS | Status: DC
  Administered 2015-08-25: 12:00:00 via INTRAVENOUS

## 2015-08-25 MED ORDER — MORPHINE SULFATE (PF) 4 MG/ML IV SOLN
4.0000 mg | INTRAVENOUS | Status: AC | PRN
  Administered 2015-08-25 (×2): 4 mg via INTRAVENOUS
  Filled 2015-08-25 (×2): qty 1

## 2015-08-25 MED ORDER — DIATRIZOATE MEGLUMINE & SODIUM 66-10 % PO SOLN
ORAL | Status: AC
  Administered 2015-08-25: 12:00:00
  Filled 2015-08-25: qty 30

## 2015-08-25 MED ORDER — ONDANSETRON HCL 4 MG/2ML IJ SOLN
4.0000 mg | INTRAMUSCULAR | Status: DC | PRN
  Administered 2015-08-25: 4 mg via INTRAVENOUS
  Filled 2015-08-25: qty 2

## 2015-08-25 NOTE — ED Provider Notes (Signed)
CSN: [id number]     Arrival date & time 08/25/15  1039 History   First MD Initiated Contact with Patient 08/25/15 1102     Chief Complaint  Patient presents with  . Rectal Pain     HPI  Pt was seen at 1105. Per pt, c/o gradual onset and worsening of persistent "rectal pain" that began 3 days ago. Has been associated with "yellow" rectal discharge. Pt s/p excision and biopsy of rectum on 08/15/15, dx anal cancer. Pt is scheduled for colonoscopy by GI Dr. Oneida Alar in 2 days. Pt states she is unable to give herself the enema prep due to her rectal pain. Denies rectal bleeding, no trauma, no abd pain, no N/V/D, no fevers.    Past Medical History  Diagnosis Date  . Depression     ?Bipolar, sees psychiatrist, Sees Dr. Riley Churches in [place]. Hospitalized a few times, Feb 2013 and Aug 2015.   Marland Kitchen Hypertension   . Anal cancer ([place])   . Kidney failure     Lithium overdose unintentional, now resolved  . Hepatitis C     treated through [facility]  . 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 Anal cancer [facility])    Past Surgical History  Procedure Laterality Date  . Back surgery      Nov 2008, Aug 2011  . Small bowel repair      June 2010. Injury to small bowel during ovarian cyst surgery  . Foreign object removal      Aug 2010, surgical tool left during ovarian surgery  . Colonoscopy  2010    [place]: non-bleeding internal hemorrhoids   . Rectal biopsy     Family History  Problem Relation Age of Onset  . Colon polyps Father   . Colon cancer Neg Hx    Social History  Substance Use Topics  . Smoking status: Never Smoker   . Smokeless tobacco: None  . Alcohol Use: 0.0 oz/week    0 Standard drinks or equivalent per week     Comment: occasionally    Review of Systems ROS: Statement: All systems negative except as marked or noted in the HPI; Constitutional: Negative for fever and chills. ; ; Eyes: Negative for eye pain, redness and discharge. ; ; ENMT: Negative for ear pain,  hoarseness, nasal congestion, sinus pressure and sore throat. ; ; Cardiovascular: Negative for chest pain, palpitations, diaphoresis, dyspnea and peripheral edema. ; ; Respiratory: Negative for cough, wheezing and stridor. ; ; Gastrointestinal: +rectal pain. Negative for nausea, vomiting, diarrhea, abdominal pain, blood in stool, hematemesis, jaundice and rectal bleeding. . ; ; Genitourinary: Negative for dysuria, flank pain and hematuria. ; ; Musculoskeletal: Negative for back pain and neck pain. Negative for swelling and trauma.; ; Skin: Negative for pruritus, rash, abrasions, blisters, bruising and skin lesion.; ; Neuro: Negative for headache, lightheadedness and neck stiffness. Negative for weakness, altered level of consciousness , altered mental status, extremity weakness, paresthesias, involuntary movement, seizure and syncope.      Allergies  Penicillins and Codeine  Home Medications   Prior to Admission medications   Medication Sig Start Date End Date Taking? Authorizing Provider  allopurinol (ZYLOPRIM) 100 MG tablet Take 100 mg by mouth daily.   Yes Historical Provider, MD  ARIPiprazole (ABILIFY) 15 MG tablet Take 15 mg by mouth daily.   Yes Historical Provider, MD  aspirin EC 81 MG tablet Take 1 tablet (81 mg total) by mouth daily. For prevention of stroke and heart attack 07/07/11  Yes Darrol Jump, MD  Biotin 5000 MCG CAPS Take 1 capsule by mouth 3 (three) times daily.    Yes Historical Provider, MD  cetirizine (ZYRTEC) 10 MG tablet Take 10 mg by mouth daily.   Yes Historical Provider, MD  Cholecalciferol (VITAMIN D) 2000 UNITS CAPS Take 1 capsule (2,000 Units total) by mouth every morning. For Vitamin D replacement and mood control. 07/07/11  Yes Darrol Jump, MD  clonazePAM (KLONOPIN) 0.5 MG tablet Take 0.5 mg by mouth at bedtime.    Yes Historical Provider, MD  cyclobenzaprine (FLEXERIL) 10 MG tablet Take 1 tablet (10 mg total) by mouth 3 (three) times daily as needed. For pain  07/07/11  Yes Darrol Jump, MD  docusate sodium (COLACE) 100 MG capsule Take 2 capsules (200 mg total) by mouth every morning. For constipation 07/07/11  Yes Darrol Jump, MD  metoprolol tartrate (LOPRESSOR) 25 MG tablet Take 25 mg by mouth 2 (two) times daily.   Yes Historical Provider, MD  oxycodone (OXY-IR) 5 MG capsule Take 5 mg by mouth every 4 (four) hours as needed for pain.    Yes Historical Provider, MD  sertraline (ZOLOFT) 100 MG tablet Take 150 mg by mouth daily.   Yes Historical Provider, MD  tolterodine (DETROL) 2 MG tablet Take 2 mg by mouth daily as needed (bladder).    Yes Historical Provider, MD  venlafaxine XR (EFFEXOR-XR) 150 MG 24 hr capsule Take 150 mg by mouth daily with breakfast.   Yes Historical Provider, MD  alendronate (FOSAMAX) 70 MG tablet Take 70 mg by mouth once a week. Take with a full glass of water on an empty stomach.(Tuesday)    Historical Provider, MD  lansoprazole (PREVACID SOLUTAB) 30 MG disintegrating tablet Take 1 tablet (30 mg total) by mouth daily. For control of stomach acid secretion and helps GERD. Patient not taking: Reported on 08/25/2015 07/07/11 08/21/15  Darrol Jump, MD   BP 140/85 mmHg  Pulse 84  Temp(Src) 97.8 F (36.6 C) (Tympanic)  Resp 16  Ht 5\' 4"  (1.626 m)  Wt 148 lb (67.132 kg)  BMI 25.39 kg/m2  SpO2 99% Physical Exam  1110: Physical examination:  Nursing notes reviewed; Vital signs and O2 SAT reviewed;  Constitutional: Well developed, Well nourished, Well hydrated, In no acute distress; Head:  Normocephalic, atraumatic; Eyes: EOMI, PERRL, No scleral icterus; ENMT: Mouth and pharynx normal, Mucous membranes moist; Neck: Supple, Full range of motion, No lymphadenopathy; Cardiovascular: Regular rate and rhythm, No gallop; Respiratory: Breath sounds clear & equal bilaterally, No wheezes.  Speaking full sentences with ease, Normal respiratory effort/excursion; Chest: Nontender, Movement normal; Abdomen: Soft, Nontender, Nondistended,  Normal bowel sounds. Rectal exam performed w/permission of pt and ED RN chaperone present.  Anal tone normal.  No stool in rectal vault. +palpable irregularity circumferentially inside anal canal, +yellow rectal drainage. No bleeding.; Genitourinary: No CVA tenderness; Extremities: Pulses normal, No tenderness, No edema, No calf edema or asymmetry.; Neuro: AA&Ox3, Major CN grossly intact.  Speech clear. No gross focal motor or sensory deficits in extremities.; Skin: Color normal, Warm, Dry.   ED Course  Procedures (including critical care time) Labs Review   Imaging Review  I have personally reviewed and evaluated these images and lab results as part of my medical decision-making.   EKG Interpretation None      MDM  MDM Reviewed: previous chart, nursing note and vitals Reviewed previous: labs and CT scan Interpretation: labs and CT scan  Results for orders placed or performed during the hospital encounter of 08/25/15  Comprehensive metabolic panel  Result Value Ref Range   Sodium 142 135 - 145 mmol/L   Potassium 4.6 3.5 - 5.1 mmol/L   Chloride 107 101 - 111 mmol/L   CO2 27 22 - 32 mmol/L   Glucose, Bld 98 65 - 99 mg/dL   BUN 14 6 - 20 mg/dL   Creatinine, Ser 0.86 0.44 - 1.00 mg/dL   Calcium 9.1 8.9 - 10.3 mg/dL   Total Protein 7.5 6.5 - 8.1 g/dL   Albumin 3.9 3.5 - 5.0 g/dL   AST 26 15 - 41 U/L   ALT 19 14 - 54 U/L   Alkaline Phosphatase 62 38 - 126 U/L   Total Bilirubin 0.3 0.3 - 1.2 mg/dL   GFR calc non Af Amer >60 >60 mL/min   GFR calc Af Amer >60 >60 mL/min   Anion gap 8 5 - 15  CBC with Differential  Result Value Ref Range   WBC 9.3 4.0 - 10.5 K/uL   RBC 4.41 3.87 - 5.11 MIL/uL   Hemoglobin 13.7 12.0 - 15.0 g/dL   HCT 40.3 36.0 - 46.0 %   MCV 91.4 78.0 - 100.0 fL   MCH 31.1 26.0 - 34.0 pg   MCHC 34.0 30.0 - 36.0 g/dL   RDW 12.9 11.5 - 15.5 %   Platelets 244 150 - 400 K/uL   Neutrophils Relative % 66 %   Neutro Abs 6.2 1.7 - 7.7 K/uL   Lymphocytes  Relative 26 %   Lymphs Abs 2.4 0.7 - 4.0 K/uL   Monocytes Relative 6 %   Monocytes Absolute 0.5 0.1 - 1.0 K/uL   Eosinophils Relative 2 %   Eosinophils Absolute 0.2 0.0 - 0.7 K/uL   Basophils Relative 0 %   Basophils Absolute 0.0 0.0 - 0.1 K/uL  Urinalysis, Routine w reflex microscopic  Result Value Ref Range   Color, Urine YELLOW YELLOW   APPearance CLEAR CLEAR   Specific Gravity, Urine 1.010 1.005 - 1.030   pH 6.5 5.0 - 8.0   Glucose, UA NEGATIVE NEGATIVE mg/dL   Hgb urine dipstick NEGATIVE NEGATIVE   Bilirubin Urine NEGATIVE NEGATIVE   Ketones, ur NEGATIVE NEGATIVE mg/dL   Protein, ur NEGATIVE NEGATIVE mg/dL   Nitrite NEGATIVE NEGATIVE   Leukocytes, UA TRACE (A) NEGATIVE  Urine microscopic-add on  Result Value Ref Range   Squamous Epithelial / LPF 0-5 (A) NONE SEEN   WBC, UA 0-5 0 - 5 WBC/hpf   RBC / HPF NONE SEEN 0 - 5 RBC/hpf   Bacteria, UA NONE SEEN NONE SEEN  I-Stat CG4 Lactic Acid, ED  Result Value Ref Range   Lactic Acid, Venous 1.67 0.5 - 2.0 mmol/L   Ct Abdomen Pelvis W Contrast 08/21/2015  CLINICAL DATA:  Hemorrhoids removal 08/15/2015. Biopsy came back cancer. Anal cancer, staging. EXAM: CT CHEST, ABDOMEN, AND PELVIS WITH CONTRAST TECHNIQUE: Multidetector CT imaging of the chest, abdomen and pelvis was performed following the standard protocol during bolus administration of intravenous contrast. CONTRAST:  151mL ISOVUE-300 IOPAMIDOL (ISOVUE-300) INJECTION 61% COMPARISON:  None. FINDINGS: CT CHEST FINDINGS Mediastinum/Lymph Nodes: No masses, pathologically enlarged lymph nodes, or other significant abnormality. Heart is normal size. Aorta is normal caliber. Lungs/Pleura: Linear areas of scarring or atelectasis in the right middle lobe, lingula, and right lower lobe. No pulmonary nodules. No pleural effusions. No confluent airspace opacities. Musculoskeletal: No chest wall mass or suspicious bone lesions identified. Nodule  within the right thyroid gland measuring up to 3.6  cm. CT ABDOMEN PELVIS FINDINGS Hepatobiliary: No masses or other significant abnormality. Pancreas: No mass, inflammatory changes, or other significant abnormality. Spleen: Within normal limits in size and appearance. Adrenals/Urinary Tract: No masses identified. No evidence of hydronephrosis. Stomach/Bowel: No evidence of obstruction, inflammatory process, or abnormal fluid collections. Vascular/Lymphatic: No pathologically enlarged lymph nodes. No evidence of abdominal aortic aneurysm. Reproductive: No mass or other significant abnormality. Prior tubal ligation. Other: No free fluid or free air. Musculoskeletal:  No suspicious bone lesions identified. IMPRESSION: No evidence of metastatic disease in the chest, abdomen or pelvis. No acute findings. Electronically Signed   By: Rolm Baptise M.D.   On: 08/21/2015 15:30    Ct Pelvis W Contrast 08/25/2015  CLINICAL DATA:  Rectal pain. Recent rectal biopsy, diagnosed with cancer. EXAM: CT PELVIS WITHOUT CONTRAST TECHNIQUE: Multidetector CT imaging of the pelvis was performed following the standard protocol without intravenous contrast. COMPARISON:  None. FINDINGS: Urinary Tract:  Normal appearing urinary bladder and distal ureters. Bowel: Mild distal rectal wall thickening. No fluid or gas collections. The remainder the included colon and small bowel have normal appearances. Normal appearing appendix. Vascular/Lymphatic: Prominent bilateral inguinal lymph nodes with little change. The largest node on the left has a short axis diameter of 11 mm on image number 39 and the largest node on the right has a short axis diameter of 9 mm on image number 39. 6 mm short axis left internal iliac lymph node on image number 27 of series 2. There is also a 9 mm short axis left obturator node on image number 28. The corresponding obturator node on the right has a 7 mm short axis diameter on image number 28. A 7 mm short axis left external iliac node on image number 26. Mild  atheromatous arterial calcifications. Reproductive: 1.2 cm calcified uterine fibroid. Bilateral tubal ligation clips. Normal appearing ovaries. Other:  Small umbilical hernia containing fat. Musculoskeletal: Lower lumbar spine degenerative changes with disc bulges. IMPRESSION: 1. Mild diffuse distal rectal wall thickening, possibly representing the recently diagnosed malignancy. 2. Borderline left pelvic and bilateral inguinal adenopathy. This could potentially represent metastatic adenopathy. 3. No acute abnormality. Electronically Signed   By: Claudie Revering M.D.   On: 08/25/2015 15:16      1120:  Pt had CT C/A/P the day before her symptoms began (above), but no comment was specifically made regarding rectal area. T/C to GI Dr. Oneida Alar, case discussed, including:  HPI, pertinent PM/SHx, VS/PE, dx testing, ED course and treatment:  She states to tell pt it is OK not to perform the enemas for her colonoscopy, she has viewed the CT images from the previous scan above, agrees anal area appears irregular (though no comment from [facility] regarding this); agrees pt will likely need another CT scan today to r/o abscess.  T/C to [facility] MD, case discussed, including:  HPI, pertinent PM/SHx, VS/PE, dx testing, ED course and treatment:  He has viewed the images from previous CT scan, states anal-rectal junction appears to have asymmetrical wall thickening R>L, no clear abscess; recommends just CT pelvis today with IV/PO contrast and have CT Tech scan to mid-thighs to try to include as much of area of concern as possible. CT Tech called and aware.   1525:  CT today reassuring (no abscess). Pt states she "feels better about that" and wants to go home now. Will tx symptomatically, f/u GI MD in 2 days as previously scheduled. Dx and  testing d/w pt.  Questions answered.  Verb understanding, agreeable to d/c home with outpt f/u.     Francine Graven, DO 08/29/15 1857

## 2015-08-25 NOTE — Discharge Instructions (Signed)
Take the prescription as directed.  Because of your pain, Dr. Oneida Alar stated it was OK for you not to do your enemas before the colonoscopy on Tuesday. Call your GI doctor tomorrow to confirm your previously scheduled appointment on Tuesday. Return to the Emergency Department immediately sooner if worsening.

## 2015-08-25 NOTE — ED Notes (Signed)
Pt states that she had surgery on a rectal abscess a week ago, and then was informed she has cancer.  She presents today with pain in her rectum.

## 2015-08-26 NOTE — Telephone Encounter (Signed)
Her CTs on 4/12 were reported as negative.  She went back to the ED yesterday and they repeated the pelvis CT due to rectal pain. It showed mild rectal wall thickening, which is likely secondary to known anal cancer. She had some enlarged pelvis lymph nodes but oncology will look into that further when she sees them.   Please tell her she does not need to do the enema for the colonoscopy. Still plan on colonoscopy tomorrow.

## 2015-08-26 NOTE — Telephone Encounter (Signed)
Pt is aware.  

## 2015-08-26 NOTE — Progress Notes (Signed)
CC'ED TO PCP 

## 2015-08-27 ENCOUNTER — Encounter (HOSPITAL_COMMUNITY)
Admission: RE | Disposition: A | Discharge status: Home / Self Care | Payer: Self-pay | Source: Ambulatory Visit | Attending: Gastroenterology

## 2015-08-27 ENCOUNTER — Encounter (HOSPITAL_COMMUNITY): Payer: Self-pay | Admitting: Lab

## 2015-08-27 ENCOUNTER — Encounter (HOSPITAL_COMMUNITY): Payer: Self-pay | Admitting: Hematology & Oncology

## 2015-08-27 ENCOUNTER — Ambulatory Visit (HOSPITAL_COMMUNITY): Payer: Medicare Other | Admitting: Anesthesiology

## 2015-08-27 ENCOUNTER — Ambulatory Visit (HOSPITAL_COMMUNITY)
Admission: RE | Admit: 2015-08-27 | Discharge: 2015-08-27 | Disposition: A | Discharge status: Home / Self Care | Payer: Medicare Other | Source: Ambulatory Visit | Attending: Gastroenterology | Admitting: Gastroenterology

## 2015-08-27 ENCOUNTER — Encounter (HOSPITAL_COMMUNITY): Payer: Self-pay | Admitting: *Deleted

## 2015-08-27 ENCOUNTER — Encounter (HOSPITAL_COMMUNITY): Payer: Medicare Other | Attending: Hematology & Oncology | Admitting: Hematology & Oncology

## 2015-08-27 VITALS — BP 180/106 | HR 100 | Temp 98.3°F | Resp 20 | Wt 150.8 lb

## 2015-08-27 DIAGNOSIS — C210 Malignant neoplasm of anus, unspecified: Secondary | ICD-10-CM

## 2015-08-27 DIAGNOSIS — G893 Neoplasm related pain (acute) (chronic): Secondary | ICD-10-CM

## 2015-08-27 DIAGNOSIS — I10 Essential (primary) hypertension: Secondary | ICD-10-CM | POA: Insufficient documentation

## 2015-08-27 DIAGNOSIS — Z1211 Encounter for screening for malignant neoplasm of colon: Secondary | ICD-10-CM

## 2015-08-27 DIAGNOSIS — Q438 Other specified congenital malformations of intestine: Secondary | ICD-10-CM | POA: Insufficient documentation

## 2015-08-27 DIAGNOSIS — K621 Rectal polyp: Secondary | ICD-10-CM | POA: Diagnosis not present

## 2015-08-27 DIAGNOSIS — Z79899 Other long term (current) drug therapy: Secondary | ICD-10-CM | POA: Diagnosis not present

## 2015-08-27 DIAGNOSIS — Z8619 Personal history of other infectious and parasitic diseases: Secondary | ICD-10-CM | POA: Diagnosis not present

## 2015-08-27 DIAGNOSIS — Z7982 Long term (current) use of aspirin: Secondary | ICD-10-CM | POA: Insufficient documentation

## 2015-08-27 DIAGNOSIS — F329 Major depressive disorder, single episode, unspecified: Secondary | ICD-10-CM | POA: Insufficient documentation

## 2015-08-27 HISTORY — PX: COLONOSCOPY WITH PROPOFOL: SHX5780

## 2015-08-27 SURGERY — COLONOSCOPY WITH PROPOFOL
Anesthesia: Monitor Anesthesia Care

## 2015-08-27 MED ORDER — METOPROLOL TARTRATE 1 MG/ML IV SOLN
5.0000 mg | Freq: Once | INTRAVENOUS | Status: AC
  Administered 2015-08-27: 5 mg via INTRAVENOUS

## 2015-08-27 MED ORDER — GLYCOPYRROLATE 0.2 MG/ML IJ SOLN
INTRAMUSCULAR | Status: AC
  Filled 2015-08-27: qty 1

## 2015-08-27 MED ORDER — FENTANYL CITRATE (PF) 100 MCG/2ML IJ SOLN
25.0000 ug | INTRAMUSCULAR | Status: DC | PRN
  Administered 2015-08-27 (×2): 50 ug via INTRAVENOUS

## 2015-08-27 MED ORDER — MIDAZOLAM HCL 2 MG/2ML IJ SOLN
INTRAMUSCULAR | Status: AC
  Filled 2015-08-27: qty 2

## 2015-08-27 MED ORDER — METOPROLOL TARTRATE 1 MG/ML IV SOLN
INTRAVENOUS | Status: AC
  Filled 2015-08-27: qty 5

## 2015-08-27 MED ORDER — LACTATED RINGERS IV SOLN
INTRAVENOUS | Status: DC
  Administered 2015-08-27: 08:00:00 via INTRAVENOUS

## 2015-08-27 MED ORDER — ONDANSETRON HCL 4 MG/2ML IJ SOLN: 4.0000 mg | Freq: Once | INTRAMUSCULAR | Status: DC | PRN

## 2015-08-27 MED ORDER — PROPOFOL 10 MG/ML IV BOLUS
INTRAVENOUS | Status: AC
  Filled 2015-08-27: qty 20

## 2015-08-27 MED ORDER — MIDAZOLAM HCL 2 MG/2ML IJ SOLN
1.0000 mg | INTRAMUSCULAR | Status: DC | PRN
  Administered 2015-08-27 (×2): 2 mg via INTRAVENOUS

## 2015-08-27 MED ORDER — FENTANYL CITRATE (PF) 100 MCG/2ML IJ SOLN
INTRAMUSCULAR | Status: DC | PRN
  Administered 2015-08-27 (×4): 25 ug via INTRAVENOUS

## 2015-08-27 MED ORDER — MIDAZOLAM HCL 5 MG/5ML IJ SOLN
INTRAMUSCULAR | Status: DC | PRN
Start: 2015-08-27 — End: 2015-08-27
  Administered 2015-08-27 (×2): 1 mg via INTRAVENOUS

## 2015-08-27 MED ORDER — OXYCODONE HCL 15 MG PO TABS
15.0000 mg | ORAL_TABLET | ORAL | Status: DC | PRN
Start: 2015-08-27 — End: 2015-09-16

## 2015-08-27 MED ORDER — FENTANYL CITRATE (PF) 100 MCG/2ML IJ SOLN
INTRAMUSCULAR | Status: AC
  Filled 2015-08-27: qty 2

## 2015-08-27 MED ORDER — PROPOFOL 500 MG/50ML IV EMUL
INTRAVENOUS | Status: DC | PRN
  Administered 2015-08-27: 10:00:00 via INTRAVENOUS
  Administered 2015-08-27: 150 ug/kg/min via INTRAVENOUS

## 2015-08-27 MED ORDER — LIDOCAINE HCL (CARDIAC) 10 MG/ML IV SOLN
INTRAVENOUS | Status: DC | PRN
  Administered 2015-08-27: 25 mg via INTRAVENOUS

## 2015-08-27 MED ORDER — SODIUM CHLORIDE BACTERIOSTATIC 0.9 % IJ SOLN
INTRAMUSCULAR | Status: AC
  Filled 2015-08-27: qty 10

## 2015-08-27 NOTE — Progress Notes (Signed)
Referral sent to Dr Arnoldo Morale for port 4/20.  Referral sent to [facility].  Records faxed on 4/18

## 2015-08-27 NOTE — Transfer of Care (Signed)
Immediate Anesthesia Transfer of Care Note  Patient: Jillian Estrada  Procedure(s) Performed: Procedure(s) with comments: COLONOSCOPY WITH PROPOFOL (N/A) - 915  Patient Location: PACU  Anesthesia Type:MAC  Level of Consciousness: awake, alert , oriented and patient cooperative  Airway & Oxygen Therapy: Patient Spontanous Breathing and Patient connected to nasal cannula oxygen  Post-op Assessment: Report given to RN and Post -op Vital signs reviewed and stable  Post vital signs: Reviewed and stable  Last Vitals:  Filed Vitals:   08/27/15 0910 08/27/15 0915  BP: 161/98 155/99  Pulse:    Temp:    Resp: 17 13    Complications: No apparent anesthesia complications

## 2015-08-27 NOTE — Progress Notes (Signed)
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   08/15/2015 Pathology Results Invasive squamous cell carcinoma with involvement of multiple margins   08/21/2015 Imaging CT [facility]-     HISTORY OF PRESENTING ILLNESS:  Jillian Estrada 60 y.o. 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  She comes to the [facility] today for consult accompanied by her husband Jillian Estrada. She is lying in the bed today, experiencing pain from her recent resection. She says "if I could just get the pain in check," indicating this would help her morale. She says that she was told Sunday that it was colorectal.  In terms of the history of her diagnosis, she says she had come up with what she thought was a hemorrhoid. For the first two weeks, she notes that it didn't bother her at all. Her husband looked at it and said it was like a "kidney bean," about the "size of the end of his fingernail." She went to a surgeon in [place], who said "you have a hemorrhoid, and if it's not better by May 16th, you need to let me know." She had a gynecological appointment and her gynecologist noted that "it was not a hemorrrhoid and that it was infected." She was set up for surgery the next day, but it was not able to be removed at that time. Two days later, she notes having it surgically removed at [facility]. She says "I thought I had it five weeks," noting that the timeline is a bit of a blur.  The surgeon called her with the lab results last Monday, and said "you have cancer, you need this and you need that."  She says she hasn't heard from the surgeon since; that Mrs. Jillian Estrada was just was advised over the phone and that was it.  Her stepdaughter's boyfriend comes here, so she wanted to approach treatment here. She notes that she "can't ride the length to [place] and has had bad luck in [place] so far", so she wants to come here.  In terms of her pain, she says it wasn't so bad until almost a week after her resection. That's when the severe pain began. She notes that she has a bad back and has a pain management specialist, who is aware of all of this. Her surgeon and her gynecologist wrote her prescriptions for percocet/oxycodone. They told her to double up on what she already had and then get those filled, but due to strict guidelines, she couldn't get the scripts filled. She was given two morphine shots, and 20 percocet 5's. She has six of them left. She spoke to her pain specialist's nurse today, and to find out what we do here at [facility], and then work with them.  As far as her pain today, she notes that she took two percocet this morning and it helped pretty well, but that she took two when she got home and it didn't help. She knows that the painkillers will constipate her and would like to avoid this.  In general, she notes that she wants to fight the cancer and is  happy to be here to get started, especially with pain control.  In terms of eating, she notes that her appetite is good and has not changed, commenting "that's why my belly is so big." She says she was working out for two years until she developed pain in the bottom of her feet, which has prevented her from exercise for the past several weeks. She notes that she is scheduled to see a podiatrist on April 27th to address this.   She is supposed to go back June 8th to get a final check on her Hepatitis C, for which she has received treatment. She notes that she has been advised her hepatitis is cured. She is followed at [facility].   ASSESSMENT: Mrs. Jillian Estrada is a very pleasant 60 yo Caucasian female who presents today for her EOT office visit. She adhered to treatment 100 percent by taking Harvoni one tablet daily at 7 am. She did not miss or doubled up on any doses. She had experienced constipation after starting treatment, but resolved at this time . Only suffered with one mild headache. Overall, tolerating therapy very well. No alcohol or anti-acid therapy. Fibroscan and Fibrosure are both consistent with fibrosis scoring being F0-F1.  Plan: 1. HCV safety labs ordered.  2. Minimal alcohol use per current guidelines.  3. HCV treatment: Successfully completed Harvoni x 8 weeks. 4. Office follow-up three months, sooner if any concerns.  5. Immunity hepatitis B: Prior exposure and + surface antibody. 6. First hepatitis A vaccine given 03/2015 and warrants #2 in near future.  7. Any questions or concerns please notify our office. 8. Instructed to proceed [facility] one month for TW #4 labs. Patient voiced understanding.   All patient's questions were answered to her satisfaction today.   Jillian Emerald, DNP, FNP-BC [facility] [place] [address] Phone [phone]   MEDICAL HISTORY:  Past Medical History  Diagnosis Date  . Depression     ?Bipolar, sees psychiatrist, Sees Dr. Riley Estrada in [place]. Hospitalized a few times, Feb 2013 and Aug 2015.   Marland Kitchen Hypertension   . Anal cancer ([place])   . Kidney failure     Lithium overdose unintentional, now resolved  . Hepatitis C     treated through [facility]  . Primary anal squamous cell carcinoma ([place]) 08/21/2015  . Anal cancer ([place])   . Cancer [facility])     Anal    SURGICAL HISTORY: Past Surgical History  Procedure Laterality Date  . Back surgery      Nov 2008, Aug 2011  . Small bowel repair      June 2010. Injury to small bowel during ovarian cyst surgery  . Foreign object removal      Aug 2010, surgical tool left during  ovarian surgery  . Colonoscopy  2010    [place]: non-bleeding internal hemorrhoids   . Rectal biopsy      SOCIAL HISTORY: Social History   Social History  . Marital Status: Married    Spouse Name: N/A  . Number of Children: N/A  . Years of Education: N/A   Occupational History  . retired     worked 61 years for [employer]  . Smoking status: Never Smoker   . Smokeless tobacco: Not on file  . Alcohol Use: 0.0 oz/week    0 Standard drinks or equivalent per week     Comment: occasionally  . Drug Use: No  .  Sexual Activity: Not on file   Other Topics Concern  . Not on file   Social History Narrative   Married; husband Jillian Estrada. 103 years married. He has two children; she has step children. 1 step grand-child. Born in [place], [place]. Lived in [place]. Now live in [place], [place] She says she has 5 dogs; pomeranians and a chihuahua.  Other than this, she says she has "no hobbies." Her husband builds show cars, which she assists with. She notes that she doesn't garden. Non-smoker, quit in 2001. Her husband quit two years ago. She drinks occasionally, noting "a beer."  FAMILY HISTORY: Family History  Problem Relation Age of Onset  . Colon polyps Father   . Colon cancer Neg Hx    Father is deceased, 2012/11/03. He had heart attacks and a brain bleed from a stroke. Mother 8, lives in [place]; "never wants me to come see her." Says her mother says she can't muster up the strength to have visitors. Had a sister, who was killed in an automobile accident. 1 adopted brother as well.  ALLERGIES:  is allergic to penicillins and codeine.  MEDICATIONS:  Current Outpatient Prescriptions  Medication Sig Dispense Refill  . alendronate (FOSAMAX) 70 MG tablet Take 70 mg by mouth once a week. Take with a full glass of water on an empty stomach.(Tuesday)    . allopurinol (ZYLOPRIM) 100 MG tablet Take 100 mg by mouth daily.    .  ARIPiprazole (ABILIFY) 15 MG tablet Take 15 mg by mouth daily.    Marland Kitchen aspirin EC 81 MG tablet Take 1 tablet (81 mg total) by mouth daily. For prevention of stroke and heart attack 1 tablet 0  . Biotin 5000 MCG CAPS Take 1 capsule by mouth 3 (three) times daily.     . cetirizine (ZYRTEC) 10 MG tablet Take 10 mg by mouth daily.    . Cholecalciferol (VITAMIN D) 2000 UNITS CAPS Take 1 capsule (2,000 Units total) by mouth every morning. For Vitamin D replacement and mood control. 30 capsule 0  . clonazePAM (KLONOPIN) 0.5 MG tablet Take 0.5 mg by mouth at bedtime.     . cyclobenzaprine (FLEXERIL) 10 MG tablet Take 1 tablet (10 mg total) by mouth 3 (three) times daily as needed. For pain 30 tablet 0  . docusate sodium (COLACE) 100 MG capsule Take 2 capsules (200 mg total) by mouth every morning. For constipation 10 capsule 0  . metoprolol tartrate (LOPRESSOR) 25 MG tablet Take 25 mg by mouth 2 (two) times daily.    Marland Kitchen oxyCODONE-acetaminophen (PERCOCET/ROXICET) 5-325 MG tablet 1 or 2 tabs PO q6h prn pain 20 tablet 0  . sertraline (ZOLOFT) 100 MG tablet Take 150 mg by mouth daily.    Marland Kitchen venlafaxine XR (EFFEXOR-XR) 150 MG 24 hr capsule Take 150 mg by mouth daily with breakfast.    . lansoprazole (PREVACID SOLUTAB) 30 MG disintegrating tablet Take 1 tablet (30 mg total) by mouth daily. For control of stomach acid secretion and helps GERD. (Patient not taking: Reported on 08/25/2015) 30 tablet 0  . oxyCODONE (ROXICODONE) 15 MG immediate release tablet Take 1 tablet (15 mg total) by mouth every 4 (four) hours as needed for pain. 90 tablet 0  . tolterodine (DETROL) 2 MG tablet Take 2 mg by mouth daily as needed (bladder). Reported on 08/27/2015     No current facility-administered medications for this visit.    Review of Systems  Constitutional: Negative.   HENT: Negative.   Eyes:  Negative.   Respiratory: Negative.   Cardiovascular: Negative.   Gastrointestinal: Negative.        Pain in the anal region  associated with lesion.  Genitourinary: Negative.   Musculoskeletal: Positive for back pain.       Chronic bad back.  Skin: Negative.   Neurological: Negative.   Endo/Heme/Allergies: Negative.   Psychiatric/Behavioral: Negative.   All other systems reviewed and are negative.  14 point ROS was done and is otherwise as detailed above or in HPI   PHYSICAL EXAMINATION: ECOG PERFORMANCE STATUS: 1 - Symptomatic but completely ambulatory  Filed Vitals:   08/27/15 1408  BP: 180/106  Pulse: 100  Temp: 98.3 F (36.8 C)  Resp: 20   Filed Weights   08/27/15 1408  Weight: 150 lb 12.8 oz (68.402 kg)    Physical Exam  Constitutional: She is oriented to person, place, and time and well-developed, well-nourished, and in no distress.  HENT:  Head: Normocephalic and atraumatic.  Nose: Nose normal.  Mouth/Throat: Oropharynx is clear and moist. No oropharyngeal exudate.  Eyes: Conjunctivae and EOM are normal. Pupils are equal, round, and reactive to light. Right eye exhibits no discharge. Left eye exhibits no discharge. No scleral icterus.  Neck: Normal range of motion. Neck supple. No tracheal deviation present. No thyromegaly present.  Cardiovascular: Normal rate, regular rhythm and normal heart sounds.  Exam reveals no gallop and no friction rub.   No murmur heard. Pulmonary/Chest: Effort normal and breath sounds normal. She has no wheezes. She has no rales.  Abdominal: Soft. Bowel sounds are normal. She exhibits no distension and no mass. There is no tenderness. There is no rebound and no guarding.  Genitourinary:  Rectal exam deferred secondary to patient's pain  Musculoskeletal: Normal range of motion. She exhibits no edema.  Lymphadenopathy:    She has no cervical adenopathy.       Left: Inguinal adenopathy present.  L side palpable lymph node.  Neurological: She is alert and oriented to person, place, and time. She has normal reflexes. No cranial nerve deficit. Gait normal.  Coordination normal.  Skin: Skin is warm and dry. No rash noted.  Psychiatric: Mood, memory, affect and judgment normal.  Nursing note and vitals reviewed.     LABORATORY DATA:  I have reviewed the data as listed Lab Results  Component Value Date   WBC 9.3 08/25/2015   HGB 13.7 08/25/2015   HCT 40.3 08/25/2015   MCV 91.4 08/25/2015   PLT 244 08/25/2015   CMP     Component Value Date/Time   NA 142 08/25/2015 1145   K 4.6 08/25/2015 1145   CL 107 08/25/2015 1145   CO2 27 08/25/2015 1145   GLUCOSE 98 08/25/2015 1145   BUN 14 08/25/2015 1145   CREATININE 0.86 08/25/2015 1145   CALCIUM 9.1 08/25/2015 1145   PROT 7.5 08/25/2015 1145   ALBUMIN 3.9 08/25/2015 1145   AST 26 08/25/2015 1145   ALT 19 08/25/2015 1145   ALKPHOS 62 08/25/2015 1145   BILITOT 0.3 08/25/2015 1145   GFRNONAA >60 08/25/2015 1145   GFRAA >60 08/25/2015 1145     RADIOGRAPHIC STUDIES: I have personally reviewed the radiological images as listed and agreed with the findings in the report.  Ct Chest W Contrast  08/21/2015  CLINICAL DATA:  Hemorrhoids removal 08/15/2015. Biopsy came back cancer. Anal cancer, staging. EXAM: CT CHEST, ABDOMEN, AND PELVIS WITH CONTRAST TECHNIQUE: Multidetector CT imaging of the chest, abdomen and pelvis was performed following the  standard protocol during bolus administration of intravenous contrast. CONTRAST:  116mL ISOVUE-300 IOPAMIDOL (ISOVUE-300) INJECTION 61% COMPARISON:  None. FINDINGS: CT CHEST FINDINGS Mediastinum/Lymph Nodes: No masses, pathologically enlarged lymph nodes, or other significant abnormality. Heart is normal size. Aorta is normal caliber. Lungs/Pleura: Linear areas of scarring or atelectasis in the right middle lobe, lingula, and right lower lobe. No pulmonary nodules. No pleural effusions. No confluent airspace opacities. Musculoskeletal: No chest wall mass or suspicious bone lesions identified. Nodule within the right thyroid gland measuring up to 3.6 cm. CT  ABDOMEN PELVIS FINDINGS Hepatobiliary: No masses or other significant abnormality. Pancreas: No mass, inflammatory changes, or other significant abnormality. Spleen: Within normal limits in size and appearance. Adrenals/Urinary Tract: No masses identified. No evidence of hydronephrosis. Stomach/Bowel: No evidence of obstruction, inflammatory process, or abnormal fluid collections. Vascular/Lymphatic: No pathologically enlarged lymph nodes. No evidence of abdominal aortic aneurysm. Reproductive: No mass or other significant abnormality. Prior tubal ligation. Other: No free fluid or free air. Musculoskeletal:  No suspicious bone lesions identified. IMPRESSION: No evidence of metastatic disease in the chest, abdomen or pelvis. No acute findings. Electronically Signed   By: Rolm Baptise M.D.   On: 08/21/2015 15:30   Ct Pelvis W Contrast  08/25/2015  CLINICAL DATA:  Rectal pain. Recent rectal biopsy, diagnosed with cancer. EXAM: CT PELVIS WITHOUT CONTRAST TECHNIQUE: Multidetector CT imaging of the pelvis was performed following the standard protocol without intravenous contrast. COMPARISON:  None. FINDINGS: Urinary Tract:  Normal appearing urinary bladder and distal ureters. Bowel: Mild distal rectal wall thickening. No fluid or gas collections. The remainder the included colon and small bowel have normal appearances. Normal appearing appendix. Vascular/Lymphatic: Prominent bilateral inguinal lymph nodes with little change. The largest node on the left has a short axis diameter of 11 mm on image number 39 and the largest node on the right has a short axis diameter of 9 mm on image number 39. 6 mm short axis left internal iliac lymph node on image number 27 of series 2. There is also a 9 mm short axis left obturator node on image number 28. The corresponding obturator node on the right has a 7 mm short axis diameter on image number 28. A 7 mm short axis left external iliac node on image number 26. Mild atheromatous  arterial calcifications. Reproductive: 1.2 cm calcified uterine fibroid. Bilateral tubal ligation clips. Normal appearing ovaries. Other:  Small umbilical hernia containing fat. Musculoskeletal: Lower lumbar spine degenerative changes with disc bulges. IMPRESSION: 1. Mild diffuse distal rectal wall thickening, possibly representing the recently diagnosed malignancy. 2. Borderline left pelvic and bilateral inguinal adenopathy. This could potentially represent metastatic adenopathy. 3. No acute abnormality. Electronically Signed   By: Claudie Revering M.D.   On: 08/25/2015 15:16   Ct Abdomen Pelvis W Contrast  08/21/2015  CLINICAL DATA:  Hemorrhoids removal 08/15/2015. Biopsy came back cancer. Anal cancer, staging. EXAM: CT CHEST, ABDOMEN, AND PELVIS WITH CONTRAST TECHNIQUE: Multidetector CT imaging of the chest, abdomen and pelvis was performed following the standard protocol during bolus administration of intravenous contrast. CONTRAST:  130mL ISOVUE-300 IOPAMIDOL (ISOVUE-300) INJECTION 61% COMPARISON:  None. FINDINGS: CT CHEST FINDINGS Mediastinum/Lymph Nodes: No masses, pathologically enlarged lymph nodes, or other significant abnormality. Heart is normal size. Aorta is normal caliber. Lungs/Pleura: Linear areas of scarring or atelectasis in the right middle lobe, lingula, and right lower lobe. No pulmonary nodules. No pleural effusions. No confluent airspace opacities. Musculoskeletal: No chest wall mass or suspicious bone lesions identified. Nodule within  the right thyroid gland measuring up to 3.6 cm. CT ABDOMEN PELVIS FINDINGS Hepatobiliary: No masses or other significant abnormality. Pancreas: No mass, inflammatory changes, or other significant abnormality. Spleen: Within normal limits in size and appearance. Adrenals/Urinary Tract: No masses identified. No evidence of hydronephrosis. Stomach/Bowel: No evidence of obstruction, inflammatory process, or abnormal fluid collections. Vascular/Lymphatic: No  pathologically enlarged lymph nodes. No evidence of abdominal aortic aneurysm. Reproductive: No mass or other significant abnormality. Prior tubal ligation. Other: No free fluid or free air. Musculoskeletal:  No suspicious bone lesions identified. IMPRESSION: No evidence of metastatic disease in the chest, abdomen or pelvis. No acute findings. Electronically Signed   By: Rolm Baptise M.D.   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 Oneida Alar 08/28/2015  Discussed anal carcinoma with the patient and her husband in detail. Discussed the curative nature of concurrent chemoradiation.  Discussed risks/benefits of chemo/XRT with one significant benefit being retention of anal sphincter. Plan will be as follows: 1. PET/CT 2. Referral to Rad Onc 3. Port placement with Dr. Arnoldo Morale 4. Chemotherapy teaching in preparation for mitomycin/5-FU 5. Review all [facility] records regarding Hepatitis C treatment 6. Pain control. Will contact her pain physician and discuss assuming her pain medications until she has completed/recovered from therapy.   I will see her back post PET for additional discussion and to answer any questions she may have.   Orders Placed This Encounter  Procedures  . NM PET Image Initial (PI) Skull Base To Thigh    Standing Status: Future     Number of Occurrences:      Standing Expiration Date: 08/26/2016    Order Specific Question:  Reason for Exam (SYMPTOM  OR DIAGNOSIS REQUIRED)    Answer:  anal carcinoma staging,    Order Specific Question:  Is the patient pregnant?    Answer:  No    Order Specific Question:  Preferred imaging location?    Answer:  [facility]    Order Specific Question:  If indicated for the ordered procedure, I authorize the administration of a radiopharmaceutical per Radiology protocol    Answer:   Yes    All questions were answered. The patient knows to call the clinic with any problems, questions or concerns.  This document serves as a record of services personally performed by Ancil Linsey, MD. It was created on her behalf by Toni Amend, a trained medical scribe. The creation of this record is based on the scribe's personal observations and the provider's statements to them. This document has been checked and approved by the attending provider.  I have reviewed the above documentation for accuracy and completeness and I agree with the above.  This note was electronically signed.    Molli Hazard, MD  08/27/2015 4:25 PM

## 2015-08-27 NOTE — Op Note (Signed)
[facility] Patient Name: Jillian Estrada Procedure Date: 08/27/2015 9:12 AM MRN: [id number] Date of Birth: 1955/08/07 Attending MD: Barney Drain , MD CSN: [id number] Age: 60 Admit Type: Outpatient Procedure:                Colonoscopy Indications:              Screening for colorectal malignant neoplasm Providers:                Barney Drain, MD, Gwenlyn Fudge, RN, Randa Spike,                            Technician Referring MD:             Ancil Linsey Medicines:                Propofol per Anesthesia Complications:            No immediate complications. Estimated Blood Loss:     Estimated blood loss was minimal. Procedure:                Pre-Anesthesia Assessment:                           - Prior to the procedure, a History and Physical                            was performed, and patient medications and                            allergies were reviewed. The patient's tolerance of                            previous anesthesia was also reviewed. The risks                            and benefits of the procedure and the sedation                            options and risks were discussed with the patient.                            All questions were answered, and informed consent                            was obtained. Prior Anticoagulants: The patient has                            taken no previous anticoagulant or antiplatelet                            agents. ASA Grade Assessment: II - A patient with                            mild systemic disease. After reviewing the risks  and benefits, the patient was deemed in                            satisfactory condition to undergo the procedure.                           After obtaining informed consent, the colonoscope                            was passed under direct vision. Throughout the                            procedure, the patient's blood pressure, pulse, and     oxygen saturations were monitored continuously. The                            [id number] scope was introduced through                            the anus and advanced to the the cecum, identified                            by appendiceal orifice and ileocecal valve. The                            colonoscopy was somewhat difficult due to                            significant looping. The patient tolerated the                            procedure well. The quality of the bowel                            preparation was excellent. The terminal ileum,                            ileocecal valve, appendiceal orifice, and rectum                            were photographed. Scope In: 9:32:58 AM Scope Out: 9:47:49 AM Scope Withdrawal Time: 0 hours 0 minutes 1 second  Total Procedure Duration: 0 hours 14 minutes 51 seconds  Findings:      The digital rectal exam findings include palpable rectal mass.      The recto-sigmoid colon and transverse colon were significantly       redundant. Advancing the scope required using manual pressure.      The entire examined colon appeared normal.      Two sessile polyps were found in the rectum. The polyps were 1 to 3 mm       in size. These polyps were removed with a cold biopsy forceps. Resection       and retrieval were complete.      No additional abnormalities were found on retroflexion.      Non-bleeding internal hemorrhoids were found. The  hemorrhoids were small. Impression:               - Palpable rectal mass found on digital rectal exam.                           - Redundant colon.                           - The entire examined colon is normal.                           - Two 1 to 3 mm polyps in the rectum, removed with                            a cold biopsy forceps. Resected and retrieved. Moderate Sedation:      Moderate (conscious) sedation was personally administered by an       anesthesia professional. The following parameters  were monitored: oxygen       saturation, heart rate, blood pressure, and response to care. Recommendation:           - Patient has a contact number available for                            emergencies. The signs and symptoms of potential                            delayed complications were discussed with the                            patient. Return to normal activities tomorrow.                            Written discharge instructions were provided to the                            patient.                           - High fiber diet.                           - Continue present medications.                           - Await pathology results.                           - Repeat colonoscopy in 5-10 years for surveillance. Procedure Code(s):        --- Professional ---                           [phone], Colonoscopy, flexible; with biopsy, single                            or multiple Diagnosis Code(s):        --- Professional ---  Z12.11, Encounter for screening for malignant                            neoplasm of colon                           K62.89, Other specified diseases of anus and rectum                           K62.1, Rectal polyp                           Q43.8, Other specified congenital malformations of                            intestine CPT copyright 2016 American Medical Association. All rights reserved. The codes documented in this report are preliminary and upon coder review may  be revised to meet current compliance requirements. Barney Drain, MD Barney Drain, MD 08/27/2015 2:28:49 PM This report has been signed electronically. Number of Addenda: 0

## 2015-08-27 NOTE — Discharge Instructions (Addendum)
You have small internal hemorrhoids and diverticulosis IN YOUR LEFT COLON. YOU HAD two SMALL POLYP REMOVED FROM YOUR Rectum.   FOLLOW A HIGH FIBER DIET. AVOID ITEMS THAT CAUSE BLOATING. See info below.  YOUR BIOPSY RESULTS WILL BE AVAILABLE IN MY CHART APR 21 AND MY OFFICE WILL CONTACT YOU IN 10-14 DAYS WITH YOUR RESULTS.   Next colonoscopy in 5-10 years.  Colonoscopy Care After Read the instructions outlined below and refer to this sheet in the next week. These discharge instructions provide you with general information on caring for yourself after you leave the hospital. While your treatment has been planned according to the most current medical practices available, unavoidable complications occasionally occur. If you have any problems or questions after discharge, call DR. Antwuan Eckley, [phone].  ACTIVITY  You may resume your regular activity, but move at a slower pace for the next 24 hours.   Take frequent rest periods for the next 24 hours.   Walking will help get rid of the air and reduce the bloated feeling in your belly (abdomen).   No driving for 24 hours (because of the medicine (anesthesia) used during the test).   You may shower.   Do not sign any important legal documents or operate any machinery for 24 hours (because of the anesthesia used during the test).    NUTRITION  Drink plenty of fluids.   You may resume your normal diet as instructed by your doctor.   Begin with a light meal and progress to your normal diet. Heavy or fried foods are harder to digest and may make you feel sick to your stomach (nauseated).   Avoid alcoholic beverages for 24 hours or as instructed.    MEDICATIONS  You may resume your normal medications.   WHAT YOU CAN EXPECT TODAY  Some feelings of bloating in the abdomen.   Passage of more gas than usual.   Spotting of blood in your stool or on the toilet paper  .  IF YOU HAD POLYPS REMOVED DURING THE COLONOSCOPY:  Eat a soft  diet IF YOU HAVE NAUSEA, BLOATING, ABDOMINAL PAIN, OR VOMITING.    FINDING OUT THE RESULTS OF YOUR TEST Not all test results are available during your visit. DR. Oneida Alar WILL CALL YOU WITHIN 14 DAYS OF YOUR PROCEDUE WITH YOUR RESULTS. Do not assume everything is normal if you have not heard from DR. Chord Takahashi, CALL HER OFFICE AT [phone].  SEEK IMMEDIATE MEDICAL ATTENTION AND CALL THE OFFICE: [phone] IF:  You have more than a spotting of blood in your stool.   Your belly is swollen (abdominal distention).   You are nauseated or vomiting.   You have a temperature over 101F.   You have abdominal pain or discomfort that is severe or gets worse throughout the day.  High-Fiber Diet A high-fiber diet changes your normal diet to include more whole grains, legumes, fruits, and vegetables. Changes in the diet involve replacing refined carbohydrates with unrefined foods. The calorie level of the diet is essentially unchanged. The Dietary Reference Intake (recommended amount) for adult males is 38 grams per day. For adult females, it is 25 grams per day. Pregnant and lactating women should consume 28 grams of fiber per day. Fiber is the intact part of a plant that is not broken down during digestion. Functional fiber is fiber that has been isolated from the plant to provide a beneficial effect in the body. PURPOSE  Increase stool bulk.   Ease and regulate bowel movements.  Lower cholesterol.  REDUCE RISK OF COLON CANCER  INDICATIONS THAT YOU NEED MORE FIBER  Constipation and hemorrhoids.   Uncomplicated diverticulosis (intestine condition) and irritable bowel syndrome.   Weight management.   As a protective measure against hardening of the arteries (atherosclerosis), diabetes, and cancer.   GUIDELINES FOR INCREASING FIBER IN THE DIET  Start adding fiber to the diet slowly. A gradual increase of about 5 more grams (2 slices of whole-wheat bread, 2 servings of most fruits or  vegetables, or 1 bowl of high-fiber cereal) per day is best. Too rapid an increase in fiber may result in constipation, flatulence, and bloating.   Drink enough water and fluids to keep your urine clear or pale yellow. Water, juice, or caffeine-free drinks are recommended. Not drinking enough fluid may cause constipation.   Eat a variety of high-fiber foods rather than one type of fiber.   Try to increase your intake of fiber through using high-fiber foods rather than fiber pills or supplements that contain small amounts of fiber.   The goal is to change the types of food eaten. Do not supplement your present diet with high-fiber foods, but replace foods in your present diet.   INCLUDE A VARIETY OF FIBER SOURCES  Replace refined and processed grains with whole grains, canned fruits with fresh fruits, and incorporate other fiber sources. White rice, white breads, and most bakery goods contain little or no fiber.   Brown whole-grain rice, buckwheat oats, and many fruits and vegetables are all good sources of fiber. These include: broccoli, Brussels sprouts, cabbage, cauliflower, beets, sweet potatoes, white potatoes (skin on), carrots, tomatoes, eggplant, squash, berries, fresh fruits, and dried fruits.   Cereals appear to be the richest source of fiber. Cereal fiber is found in whole grains and bran. Bran is the fiber-rich outer coat of cereal grain, which is largely removed in refining. In whole-grain cereals, the bran remains. In breakfast cereals, the largest amount of fiber is found in those with "bran" in their names. The fiber content is sometimes indicated on the label.   You may need to include additional fruits and vegetables each day.   In baking, for 1 cup white flour, you may use the following substitutions:   1 cup whole-wheat flour minus 2 tablespoons.   1/2 cup white flour plus 1/2 cup whole-wheat flour.   Polyps, Colon  A polyp is extra tissue that grows inside your body.  Colon polyps grow in the large intestine. The large intestine, also called the colon, is part of your digestive system. It is a long, hollow tube at the end of your digestive tract where your body makes and stores stool. Most polyps are not dangerous. They are benign. This means they are not cancerous. But over time, some types of polyps can turn into cancer. Polyps that are smaller than a pea are usually not harmful. But larger polyps could someday become or may already be cancerous. To be safe, doctors remove all polyps and test them.    PREVENTION There is not one sure way to prevent polyps. You might be able to lower your risk of getting them if you:  Eat more fruits and vegetables and less fatty food.   Do not smoke.   Avoid alcohol.   Exercise every day.   Lose weight if you are overweight.   Eating more calcium and folate can also lower your risk of getting polyps. Some foods that are rich in calcium are milk, cheese, and broccoli.  Some foods that are rich in folate are chickpeas, kidney beans, and spinach.    Diverticulosis Diverticulosis is a common condition that develops when small pouches (diverticula) form in the wall of the colon. The risk of diverticulosis increases with age. It happens more often in people who eat a low-fiber diet. Most individuals with diverticulosis have no symptoms. Those individuals with symptoms usually experience belly (abdominal) pain, constipation, or loose stools (diarrhea).  HOME CARE INSTRUCTIONS  Increase the amount of fiber in your diet as directed by your caregiver or dietician. This may reduce symptoms of diverticulosis.   Drink at least 6 to 8 glasses of water each day to prevent constipation.   Try not to strain when you have a bowel movement.   Avoiding nuts and seeds to prevent complications is NOT NECESSARY.   FOODS HAVING HIGH FIBER CONTENT INCLUDE:  Fruits. Apple, peach, pear, tangerine, raisins, prunes.   Vegetables.  Brussels sprouts, asparagus, broccoli, cabbage, carrot, cauliflower, romaine lettuce, spinach, summer squash, tomato, winter squash, zucchini.   Starchy Vegetables. Baked beans, kidney beans, lima beans, split peas, lentils, potatoes (with skin).   Grains. Whole wheat bread, brown rice, bran flake cereal, plain oatmeal, white rice, shredded wheat, bran muffins.   SEEK IMMEDIATE MEDICAL CARE IF:  You develop increasing pain or severe bloating.   You have an oral temperature above 101F.   You develop vomiting or bowel movements that are bloody or black.   Hemorrhoids Hemorrhoids are dilated (enlarged) veins around the rectum. Sometimes clots will form in the veins. This makes them swollen and painful. These are called thrombosed hemorrhoids. Causes of hemorrhoids include:  Constipation.   Straining to have a bowel movement.   HEAVY LIFTING  HOME CARE INSTRUCTIONS  Eat a well balanced diet and drink 6 to 8 glasses of water every day to avoid constipation. You may also use a bulk laxative.   Avoid straining to have bowel movements.   Keep anal area dry and clean.   Do not use a donut shaped pillow or sit on the toilet for long periods. This increases blood pooling and pain.   Move your bowels when your body has the urge; this will require less straining and will decrease pain and pressure.

## 2015-08-27 NOTE — Anesthesia Preprocedure Evaluation (Signed)
Anesthesia Evaluation  Patient identified by MRN, date of birth, ID band Patient awake    Reviewed: Allergy & Precautions, NPO status , Patient's Chart, lab work & pertinent test results, reviewed documented beta blocker date and time   Airway Mallampati: III  TM Distance: >3 FB Neck ROM: Full    Dental  (+) Teeth Intact, Caps, Missing,    Pulmonary    Pulmonary exam normal        Cardiovascular hypertension, Pt. on medications and Pt. on home beta blockers Normal cardiovascular exam     Neuro/Psych Depression    GI/Hepatic (+) Hepatitis -, C  Endo/Other    Renal/GU Renal disease     Musculoskeletal   Abdominal Normal abdominal exam  (+)   Peds  Hematology   Anesthesia Other Findings   Reproductive/Obstetrics                             Anesthesia Physical Anesthesia Plan  ASA: III  Anesthesia Plan: MAC   Post-op Pain Management:    Induction: Intravenous  Airway Management Planned: Nasal Cannula  Additional Equipment:   Intra-op Plan:   Post-operative Plan: Extubation in OR  Informed Consent: I have reviewed the patients History and Physical, chart, labs and discussed the procedure including the risks, benefits and alternatives for the proposed anesthesia with the patient or authorized representative who has indicated his/her understanding and acceptance.   Dental advisory given  Plan Discussed with: CRNA  Anesthesia Plan Comments:         Anesthesia Quick Evaluation

## 2015-08-27 NOTE — Anesthesia Procedure Notes (Signed)
Procedure Name: MAC Date/Time: 08/27/2015 8:19 AM Performed by: Andree Elk, Mickie Kozikowski A Pre-anesthesia Checklist: Patient identified, Emergency Drugs available, Suction available, Patient being monitored and Timeout performed Patient Re-evaluated:Patient Re-evaluated prior to inductionOxygen Delivery Method: Simple face mask

## 2015-08-27 NOTE — Anesthesia Postprocedure Evaluation (Signed)
Anesthesia Post Note  Patient: Jillian Estrada  Procedure(s) Performed: Procedure(s) (LRB): COLONOSCOPY WITH PROPOFOL (N/A)  Patient location during evaluation: PACU Anesthesia Type: MAC Level of consciousness: awake and alert and oriented Pain management: pain level controlled Vital Signs Assessment: post-procedure vital signs reviewed and stable Respiratory status: spontaneous breathing and patient connected to nasal cannula oxygen Cardiovascular status: stable Postop Assessment: no signs of nausea or vomiting Anesthetic complications: no    Last Vitals:  Filed Vitals:   08/27/15 0910 08/27/15 0915  BP: 161/98 155/99  Pulse:    Temp:    Resp: 17 13    Last Pain:  Filed Vitals:   08/27/15 1011  PainSc: 8                  ADAMS, AMY A

## 2015-08-27 NOTE — Patient Instructions (Addendum)
Jillian Estrada at [facility] Discharge Instructions  RECOMMENDATIONS MADE BY THE CONSULTANT AND ANY TEST RESULTS WILL BE SENT TO YOUR REFERRING PHYSICIAN.     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  The best chance of cure is to do radiation and chemotherapy at the same time.  Recommends port placement to have your chemotherapy given.  Dr Arnoldo Morale can put your port in  Need to get a PET scan scheduled-first available    After treatment then we will follow you (we follow the NCCN guidelines)   Referral to radiation oncology in [place].   We will do teaching prior to your receiving chemotherapy.  Lupita Raider is our nurse navigator.  She will do your teaching and will be a guide when you start your journey here with [place].  Her number is [phone].  She has a voicemail, if you have any questions you can call her.  From today forward we will manage your pain medication then once you are completed treatment and healed then you can return to see you pain medication doctor .  Return to see the doctor after your PET scan. Please call the clinic if you have any questions or concerns       Constipation Sheet: It is common for patients who are undergoing treatment and taking certain prescribed medications to experience side-effects with constipation.  If you experience constipation, please take stool softener such as Colace or Senna one tablet twice a day everyday to avoid constipation. You can take up to four senna twice daily if needed.  These medications are available over the counter.  Of course, if you have diarrhea, stop taking stool softeners.  Drinking plenty of fluid, eating fruits and vegetable, and being active also reduces the risk of constipation.   If despite taking stool softeners, and you still have no bowel  movement for 2 days or more than your normal bowel habit frequency, please take one of the following over the counter laxatives:  MiraLax, Milk of Magnesia or Mag Citrate everyday and contact me immediately for further instructions.  The goal is to have at least one bowel movement every other day.   Sometimes patients need a combination of miralax and senna to go daily.         Thank you for choosing [facility] to provide your oncology and hematology care.  To afford each patient quality time with our provider, please arrive at least 15 minutes before your scheduled appointment time.   Beginning January 23rd 2017 lab work for the [facility] will be done in the  Main lab at [facility] on 1st floor. If you have a lab appointment with the [facility] please come in thru the  Main Entrance and check in at the main information desk  You need to re-schedule your appointment should you arrive 10 or more minutes late.  We strive to give you quality time with our providers, and arriving late affects you and other patients whose appointments are after yours.  Also, if you no show three or more times for appointments you may be dismissed from the clinic at the providers discretion.     Again, thank you for choosing [facility].  Our hope is that these requests will decrease the  amount of time that you wait before being seen by our physicians.       _____________________________________________________________  Should you have questions after your visit to [facility], please contact our office at [phone] between the hours of 8:30 a.m. and 4:30 p.m.  Voicemails left after 4:30 p.m. will not be returned until the following business day.  For prescription refill requests, have your pharmacy contact our office.         Resources For Cancer Patients and their Caregivers ? American Cancer Society: Can assist with  transportation, wigs, general needs, runs Look Good Feel Better.        903-286-2744 ? Cancer Care: Provides financial assistance, online support groups, medication/co-pay assistance.  1-800-813-HOPE 229-411-5525) ? [facility] Assists [place] cancer patients and their families through emotional , educational and financial support.  931-135-7668 ? [place] DSS Where to apply for food stamps, Medicaid and utility assistance. 602-696-9846 ? [facility]: Transportation to medical appointments. 343-599-3711 ? Social Security Administration: May apply for disability if have a Stage IV cancer. 331-269-0627 9176198038 ? [employer], Disability and Transit Services: Assists with nutrition, care and transit needs. 713-287-3585

## 2015-08-27 NOTE — H&P (Signed)
Primary Care Physician:  Tobe Sos, MD Primary Gastroenterologist:  Dr. Oneida Alar  Pre-Procedure History & Physical: HPI:  Jillian Estrada is a 60 y.o. female here for [place].   Past Medical History  Diagnosis Date  . Depression     ?Bipolar, sees psychiatrist, Sees Dr. Riley Churches in [place]. Hospitalized a few times, Feb 2013 and Aug 2015.   Marland Kitchen Hypertension   . Anal cancer ([place])   . Kidney failure     Lithium overdose unintentional, now resolved  . Hepatitis C     treated through [facility]  . 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 Anal cancer [facility])     Past Surgical History  Procedure Laterality Date  . Back surgery      Nov 2008, Aug 2011  . Small bowel repair      June 2010. Injury to small bowel during ovarian cyst surgery  . Foreign object removal      Aug 2010, surgical tool left during ovarian surgery  . Colonoscopy  2010    [place]: non-bleeding internal hemorrhoids   . Rectal biopsy      Prior to Admission medications   Medication Sig Start Date End Date Taking? Authorizing Provider  alendronate (FOSAMAX) 70 MG tablet Take 70 mg by mouth once a week. Take with a full glass of water on an empty stomach.(Tuesday)   Yes Historical Provider, MD  allopurinol (ZYLOPRIM) 100 MG tablet Take 100 mg by mouth daily.   Yes Historical Provider, MD  ARIPiprazole (ABILIFY) 15 MG tablet Take 15 mg by mouth daily.   Yes Historical Provider, MD  aspirin EC 81 MG tablet Take 1 tablet (81 mg total) by mouth daily. For prevention of stroke and heart attack 07/07/11  Yes Darrol Jump, MD  Biotin 5000 MCG CAPS Take 1 capsule by mouth 3 (three) times daily.    Yes Historical Provider, MD  cetirizine (ZYRTEC) 10 MG tablet Take 10 mg by mouth daily.   Yes Historical Provider, MD  Cholecalciferol (VITAMIN D) 2000 UNITS CAPS Take 1 capsule (2,000 Units total) by mouth every morning. For Vitamin D replacement and mood control. 07/07/11  Yes Darrol Jump, MD  clonazePAM (KLONOPIN) 0.5 MG tablet Take 0.5 mg by mouth at bedtime.    Yes Historical Provider, MD  cyclobenzaprine (FLEXERIL) 10 MG tablet Take 1 tablet (10 mg total) by mouth 3 (three) times daily as needed. For pain 07/07/11  Yes Darrol Jump, MD  docusate sodium (COLACE) 100 MG capsule Take 2 capsules (200 mg total) by mouth every morning. For constipation 07/07/11  Yes Darrol Jump, MD  lansoprazole (PREVACID SOLUTAB) 30 MG disintegrating tablet Take 1 tablet (30 mg total) by mouth daily. For control of stomach acid secretion and helps GERD. Patient not taking: Reported on 08/25/2015 07/07/11 08/21/15 Yes Darrol Jump, MD  metoprolol tartrate (LOPRESSOR) 25 MG tablet Take 25 mg by mouth 2 (two) times daily.   Yes Historical Provider, MD  oxycodone (OXY-IR) 5 MG capsule Take 5 mg by mouth every 4 (four) hours as needed for pain.    Yes Historical Provider, MD  oxyCODONE-acetaminophen (PERCOCET/ROXICET) 5-325 MG tablet 1 or 2 tabs PO q6h prn pain 08/25/15  Yes Francine Graven, DO  sertraline (ZOLOFT) 100 MG tablet Take 150 mg by mouth daily.   Yes Historical Provider, MD  tolterodine (DETROL) 2 MG tablet Take 2 mg by mouth daily as needed (bladder).    Yes  Historical Provider, MD  venlafaxine XR (EFFEXOR-XR) 150 MG 24 hr capsule Take 150 mg by mouth daily with breakfast.   Yes Historical Provider, MD    Allergies as of 08/21/2015 - Review Complete 08/21/2015  Allergen Reaction Noted  . Penicillins Hives 06/30/2011  . Codeine Nausea Only 06/30/2011    Family History  Problem Relation Age of Onset  . Colon polyps Father   . Colon cancer Neg Hx     Social History   Social History  . Marital Status: Married    Spouse Name: N/A  . Number of Children: N/A  . Years of Education: N/A   Occupational History  . retired     worked 60 years for [employer]  . Smoking status: Never Smoker   . Smokeless tobacco: Not on file  .  Alcohol Use: 0.0 oz/week    0 Standard drinks or equivalent per week     Comment: occasionally  . Drug Use: No  . Sexual Activity: Not on file   Other Topics Concern  . Not on file   Social History Narrative    Review of Systems: See HPI, otherwise negative ROS   Physical Exam: BP 151/94 mmHg  Pulse 90  Temp(Src) 97.2 F (36.2 C) (Oral)  Resp 20  Ht 5\' 4"  (1.626 m)  Wt 148 lb (67.132 kg)  BMI 25.39 kg/m2  SpO2 98% General:   Alert,  pleasant and cooperative in NAD Head:  Normocephalic and atraumatic. Neck:  Supple; Lungs:  Clear throughout to auscultation.    Heart:  Regular rate and rhythm. Abdomen:  Soft, nontender and nondistended. Normal bowel sounds, without guarding, and without rebound.   Neurologic:  Alert and  oriented x4;  grossly normal neurologically.  Impression/Plan:     SCREENING  Plan:  1. TCS TODAY

## 2015-08-29 ENCOUNTER — Encounter (HOSPITAL_COMMUNITY): Payer: Self-pay | Admitting: Gastroenterology

## 2015-08-29 ENCOUNTER — Encounter (HOSPITAL_COMMUNITY)
Admission: RE | Admit: 2015-08-29 | Discharge: 2015-08-29 | Disposition: A | Discharge status: Home / Self Care | Payer: Medicare Other | Source: Ambulatory Visit | Attending: General Surgery | Admitting: General Surgery

## 2015-08-30 ENCOUNTER — Encounter (HOSPITAL_COMMUNITY)
Admission: RE | Disposition: A | Discharge status: Home / Self Care | Payer: Self-pay | Source: Ambulatory Visit | Attending: General Surgery

## 2015-08-30 ENCOUNTER — Ambulatory Visit (HOSPITAL_COMMUNITY): Payer: Medicare Other

## 2015-08-30 ENCOUNTER — Encounter (HOSPITAL_COMMUNITY): Payer: Self-pay | Admitting: Anesthesiology

## 2015-08-30 ENCOUNTER — Ambulatory Visit (HOSPITAL_COMMUNITY)
Admission: RE | Admit: 2015-08-30 | Discharge: 2015-08-30 | Disposition: A | Discharge status: Home / Self Care | Payer: Medicare Other | Source: Ambulatory Visit | Attending: General Surgery | Admitting: General Surgery

## 2015-08-30 ENCOUNTER — Ambulatory Visit (HOSPITAL_COMMUNITY): Payer: Medicare Other | Admitting: Anesthesiology

## 2015-08-30 DIAGNOSIS — F329 Major depressive disorder, single episode, unspecified: Secondary | ICD-10-CM | POA: Insufficient documentation

## 2015-08-30 DIAGNOSIS — C210 Malignant neoplasm of anus, unspecified: Secondary | ICD-10-CM | POA: Diagnosis not present

## 2015-08-30 DIAGNOSIS — Z95828 Presence of other vascular implants and grafts: Secondary | ICD-10-CM

## 2015-08-30 DIAGNOSIS — Z452 Encounter for adjustment and management of vascular access device: Secondary | ICD-10-CM | POA: Insufficient documentation

## 2015-08-30 DIAGNOSIS — I10 Essential (primary) hypertension: Secondary | ICD-10-CM | POA: Diagnosis not present

## 2015-08-30 DIAGNOSIS — F319 Bipolar disorder, unspecified: Secondary | ICD-10-CM | POA: Diagnosis not present

## 2015-08-30 DIAGNOSIS — Z7982 Long term (current) use of aspirin: Secondary | ICD-10-CM | POA: Diagnosis not present

## 2015-08-30 DIAGNOSIS — Z79899 Other long term (current) drug therapy: Secondary | ICD-10-CM | POA: Diagnosis not present

## 2015-08-30 HISTORY — PX: PORTACATH PLACEMENT: SHX2246

## 2015-08-30 SURGERY — INSERTION, TUNNELED CENTRAL VENOUS DEVICE, WITH PORT
Anesthesia: Monitor Anesthesia Care | Laterality: Left

## 2015-08-30 MED ORDER — FENTANYL CITRATE (PF) 100 MCG/2ML IJ SOLN: 25.0000 ug | INTRAMUSCULAR | Status: DC | PRN

## 2015-08-30 MED ORDER — SODIUM CHLORIDE 0.9 % IV SOLN
INTRAVENOUS | Status: DC | PRN
  Administered 2015-08-30: 500 mL via INTRAMUSCULAR

## 2015-08-30 MED ORDER — KETOROLAC TROMETHAMINE 30 MG/ML IJ SOLN
30.0000 mg | Freq: Once | INTRAMUSCULAR | Status: AC
  Administered 2015-08-30: 30 mg via INTRAVENOUS

## 2015-08-30 MED ORDER — KETOROLAC TROMETHAMINE 30 MG/ML IJ SOLN
INTRAMUSCULAR | Status: AC
  Filled 2015-08-30: qty 1

## 2015-08-30 MED ORDER — FENTANYL CITRATE (PF) 100 MCG/2ML IJ SOLN
25.0000 ug | INTRAMUSCULAR | Status: AC
  Administered 2015-08-30 (×2): 25 ug via INTRAVENOUS
  Filled 2015-08-30: qty 2

## 2015-08-30 MED ORDER — PROPOFOL 500 MG/50ML IV EMUL
INTRAVENOUS | Status: DC | PRN
  Administered 2015-08-30: 75 ug/kg/min via INTRAVENOUS

## 2015-08-30 MED ORDER — SODIUM CHLORIDE 0.9 % IV SOLN: INTRAVENOUS | Status: DC | PRN

## 2015-08-30 MED ORDER — MIDAZOLAM HCL 2 MG/2ML IJ SOLN
1.0000 mg | INTRAMUSCULAR | Status: DC | PRN
  Administered 2015-08-30: 2 mg via INTRAVENOUS
  Filled 2015-08-30: qty 2

## 2015-08-30 MED ORDER — LIDOCAINE HCL (PF) 1 % IJ SOLN
INTRAMUSCULAR | Status: AC
  Filled 2015-08-30: qty 30

## 2015-08-30 MED ORDER — PROPOFOL 10 MG/ML IV BOLUS
INTRAVENOUS | Status: DC | PRN
  Administered 2015-08-30: 10 mg via INTRAVENOUS

## 2015-08-30 MED ORDER — MIDAZOLAM HCL 5 MG/5ML IJ SOLN
INTRAMUSCULAR | Status: DC | PRN
  Administered 2015-08-30: 2 mg via INTRAVENOUS

## 2015-08-30 MED ORDER — VANCOMYCIN HCL IN DEXTROSE 1-5 GM/200ML-% IV SOLN
1000.0000 mg | INTRAVENOUS | Status: AC
  Administered 2015-08-30: 1000 mg via INTRAVENOUS
  Filled 2015-08-30: qty 200

## 2015-08-30 MED ORDER — HEPARIN SOD (PORK) LOCK FLUSH 100 UNIT/ML IV SOLN
INTRAVENOUS | Status: AC
  Filled 2015-08-30: qty 5

## 2015-08-30 MED ORDER — PROPOFOL 10 MG/ML IV BOLUS
INTRAVENOUS | Status: AC
  Filled 2015-08-30: qty 20

## 2015-08-30 MED ORDER — MIDAZOLAM HCL 2 MG/2ML IJ SOLN
INTRAMUSCULAR | Status: AC
  Filled 2015-08-30: qty 2

## 2015-08-30 MED ORDER — ONDANSETRON HCL 4 MG/2ML IJ SOLN: 4.0000 mg | Freq: Once | INTRAMUSCULAR | Status: DC | PRN

## 2015-08-30 MED ORDER — LIDOCAINE HCL (PF) 1 % IJ SOLN
INTRAMUSCULAR | Status: DC | PRN
  Administered 2015-08-30: 8 mL

## 2015-08-30 MED ORDER — HEPARIN SOD (PORK) LOCK FLUSH 100 UNIT/ML IV SOLN
INTRAVENOUS | Status: DC | PRN
  Administered 2015-08-30: 500 [IU]

## 2015-08-30 MED ORDER — LACTATED RINGERS IV SOLN
INTRAVENOUS | Status: DC
  Administered 2015-08-30: 10:00:00 via INTRAVENOUS

## 2015-08-30 MED ORDER — ONDANSETRON HCL 4 MG/2ML IJ SOLN
4.0000 mg | Freq: Once | INTRAMUSCULAR | Status: AC
  Administered 2015-08-30: 4 mg via INTRAVENOUS
  Filled 2015-08-30: qty 2

## 2015-08-30 SURGICAL SUPPLY — 31 items
BAG DECANTER FOR FLEXI CONT (MISCELLANEOUS) ×3 IMPLANT
BAG HAMPER (MISCELLANEOUS) ×3 IMPLANT
CHLORAPREP W/TINT 10.5 ML (MISCELLANEOUS) ×3 IMPLANT
CLOTH BEACON ORANGE TIMEOUT ST (SAFETY) ×3 IMPLANT
COVER LIGHT HANDLE STERIS (MISCELLANEOUS) ×6 IMPLANT
DECANTER SPIKE VIAL GLASS SM (MISCELLANEOUS) ×3 IMPLANT
DERMABOND ADVANCED (GAUZE/BANDAGES/DRESSINGS) ×2
DERMABOND ADVANCED .7 DNX12 (GAUZE/BANDAGES/DRESSINGS) ×1 IMPLANT
DRAPE C-ARM FOLDED MOBILE STRL (DRAPES) ×3 IMPLANT
ELECT REM PT RETURN 9FT ADLT (ELECTROSURGICAL) ×3
ELECTRODE REM PT RTRN 9FT ADLT (ELECTROSURGICAL) ×1 IMPLANT
GLOVE BIOGEL PI IND STRL 7.0 (GLOVE) ×1 IMPLANT
GLOVE BIOGEL PI INDICATOR 7.0 (GLOVE) ×2
GLOVE ECLIPSE 6.5 STRL STRAW (GLOVE) ×3 IMPLANT
GLOVE EXAM NITRILE MD LF STRL (GLOVE) ×3 IMPLANT
GLOVE SURG SS PI 7.5 STRL IVOR (GLOVE) ×3 IMPLANT
GOWN STRL REUS W/TWL LRG LVL3 (GOWN DISPOSABLE) ×6 IMPLANT
IV NS 500ML (IV SOLUTION) ×2
IV NS 500ML BAXH (IV SOLUTION) ×1 IMPLANT
KIT PORT POWER 8FR ISP MRI (Port) ×3 IMPLANT
KIT ROOM TURNOVER APOR (KITS) ×3 IMPLANT
MANIFOLD NEPTUNE II (INSTRUMENTS) ×3 IMPLANT
NEEDLE HYPO 25X1 1.5 SAFETY (NEEDLE) ×3 IMPLANT
PACK MINOR (CUSTOM PROCEDURE TRAY) ×3 IMPLANT
PAD ARMBOARD 7.5X6 YLW CONV (MISCELLANEOUS) ×3 IMPLANT
SET BASIN LINEN APH (SET/KITS/TRAYS/PACK) ×3 IMPLANT
SUT VIC AB 3-0 SH 27 (SUTURE) ×2
SUT VIC AB 3-0 SH 27X BRD (SUTURE) ×1 IMPLANT
SUT VIC AB 4-0 PS2 27 (SUTURE) ×3 IMPLANT
SYR 20CC LL (SYRINGE) ×3 IMPLANT
SYR CONTROL 10ML LL (SYRINGE) ×3 IMPLANT

## 2015-08-30 NOTE — Discharge Instructions (Signed)
Implanted Port Insertion, Care After °Refer to this sheet in the next few weeks. These instructions provide you with information on caring for yourself after your procedure. Your health care provider may also give you more specific instructions. Your treatment has been planned according to current medical practices, but problems sometimes occur. Call your health care provider if you have any problems or questions after your procedure. °WHAT TO EXPECT AFTER THE PROCEDURE °After your procedure, it is typical to have the following:  °· Discomfort at the port insertion site. Ice packs to the area will help. °· Bruising on the skin over the port. This will subside in 3-4 days. °HOME CARE INSTRUCTIONS °· After your port is placed, you will get a manufacturer's information card. The card has information about your port. Keep this card with you at all times.   °· Know what kind of port you have. There are many types of ports available.   °· Wear a medical alert bracelet in case of an emergency. This can help alert health care workers that you have a port.   °· The port can stay in for as long as your health care provider believes it is necessary.   °· A home health care nurse may give medicines and take care of the port.   °· You or a family member can get special training and directions for giving medicine and taking care of the port at home.   °SEEK MEDICAL CARE IF:  °· Your port does not flush or you are unable to get a blood return.   °· You have a fever or chills. °SEEK IMMEDIATE MEDICAL CARE IF: °· You have new fluid or pus coming from your incision.   °· You notice a bad smell coming from your incision site.   °· You have swelling, pain, or more redness at the incision or port site.   °· You have chest pain or shortness of breath. °  °This information is not intended to replace advice given to you by your health care provider. Make sure you discuss any questions you have with your health care provider. °  °Document  Released: 02/15/2013 Document Revised: 05/02/2013 Document Reviewed: 02/15/2013 °Elsevier Interactive Patient Education ©2016 Else[employer] Inc[employer] °Implanted Port Home Guide °An implanted port is a type of central line that is placed under the skin. Central lines are used to provide IV access when treatment or nutrition needs to be given through a person's veins. Implanted ports are used for long-term IV access. An implanted port may be placed because:  °· You need IV medicine that would be irritating to the small veins in your hands or arms.   °· You need long-term IV medicines, such as antibiotics.   °· You need IV nutrition for a long period.   °· You need frequent blood draws for lab tests.   °· You need dialysis.   °Implanted ports are usually placed in the chest area, but they can also be placed in the upper arm, the abdomen, or the leg. An implanted port has two main parts:  °· Reservoir. The reservoir is round and will appear as a small, raised area under your skin. The reservoir is the part where a needle is inserted to give medicines or draw blood.   °· Catheter. The catheter is a thin, flexible tube that extends from the reservoir. The catheter is placed into a large vein. Medicine that is inserted into the reservoir goes into the catheter and then into the vein.   °HOW WILL I CARE FOR MY INCISION SITE? °Do not get the   incision site wet. Bathe or shower as directed by your health care provider.  °HOW IS MY PORT ACCESSED? °Special steps must be taken to access the port:  °· Before the port is accessed, a numbing cream can be placed on the skin. This helps numb the skin over the port site.   °· Your health care provider uses a sterile technique to access the port. °· Your health care provider must put on a mask and sterile gloves. °· The skin over your port is cleaned carefully with an antiseptic and allowed to dry. °· The port is gently pinched between sterile gloves, and a needle is inserted into the  port. °· Only "non-coring" port needles should be used to access the port. Once the port is accessed, a blood return should be checked. This helps ensure that the port is in the vein and is not clogged.   °· If your port needs to remain accessed for a constant infusion, a clear (transparent) bandage will be placed over the needle site. The bandage and needle will need to be changed every week, or as directed by your health care provider.   °· Keep the bandage covering the needle clean and dry. Do not get it wet. Follow your health care provider's instructions on how to take a shower or bath while the port is accessed.   °· If your port does not need to stay accessed, no bandage is needed over the port.   °WHAT IS FLUSHING? °Flushing helps keep the port from getting clogged. Follow your health care provider's instructions on how and when to flush the port. Ports are usually flushed with saline solution or a medicine called heparin. The need for flushing will depend on how the port is used.  °· If the port is used for intermittent medicines or blood draws, the port will need to be flushed:   °· After medicines have been given.   °· After blood has been drawn.   °· As part of routine maintenance.   °· If a constant infusion is running, the port may not need to be flushed.   °HOW LONG WILL MY PORT STAY IMPLANTED? °The port can stay in for as long as your health care provider thinks it is needed. When it is time for the port to come out, surgery will be done to remove it. The procedure is similar to the one performed when the port was put in.  °WHEN SHOULD I SEEK IMMEDIATE MEDICAL CARE? °When you have an implanted port, you should seek immediate medical care if:  °· You notice a bad smell coming from the incision site.   °· You have swelling, redness, or drainage at the incision site.   °· You have more swelling or pain at the port site or the surrounding area.   °· You have a fever that is not controlled with  medicine. °  °This information is not intended to replace advice given to you by your health care provider. Make sure you discuss any questions you have with your health care provider. °  °Document Released: 04/27/2005 Document Revised: 02/15/2013 Document Reviewed: 01/02/2013 °Elsevier Interactive Patient Education ©2016 Elsevier Inc. ° °PATIENT INSTRUCTIONS °POST-ANESTHESIA ° °IMMEDIATELY FOLLOWING SURGERY:  Do not drive or operate machinery for the first twenty four hours after surgery.  Do not make any important decisions for twenty four hours after surgery or while taking narcotic pain medications or sedatives.  If you develop intractable nausea and vomiting or a severe headache please notify your doctor immediately. ° °FOLLOW-UP:  Please make an   appointment with your surgeon as instructed. You do not need to follow up with anesthesia unless specifically instructed to do so. ° °WOUND CARE INSTRUCTIONS (if applicable):  Keep a dry clean dressing on the anesthesia/puncture wound site if there is drainage.  Once the wound has quit draining you may leave it open to air.  Generally you should leave the bandage intact for twenty four hours unless there is drainage.  If the epidural site drains for more than 36-48 hours please call the anesthesia department. ° °QUESTIONS?:  Please feel free to call your physician or the hospital operator if you have any questions, and they will be happy to assist you.    ° ° ° °

## 2015-08-30 NOTE — H&P (Signed)
  NTS SOAP Note  Vital Signs:  Vitals as of: [id number]: Systolic [id number]: Diastolic 94: Heart Rate 86: Temp 98.4F (Temporal): Height 2ft 4in: Weight 148Lbs 0 Ounces: Pain Level 6: BMI 25.4   BMI : 25.4 kg/m2  Subjective: This 60 year old female presents for of referred for portacath insertion.  Is about to undergo chemotherapy for anal carcinoma.  Review of Symptoms:  Constitutional:negative Head:negative    Eyes:negative Nose/Mouth/Throat:negative Cardiovascular:  negative Respiratory:negative rectal pain frequency neck and back pain Skin:negative Hematolgic/Lymphatic:negative Allergic/Immunologic:negative     Past Medical History:  Reviewed  Past Medical History    Social History:Reviewed  Social History  Preferred Language: English Race:  White Ethnicity: Not Hispanic / Latino Age: 60 year Marital Status:  M   Smoking Status: Never smoker reviewed on 08/29/2015 Functional Status reviewed on 08/29/2015 ------------------------------------------------ Bathing: Normal Cooking: Normal Dressing: Normal Driving: Normal Eating: Normal Managing Meds: Normal Oral Care: Normal Shopping: Normal Toileting: Normal Transferring: Normal Walking: Normal Cognitive Status reviewed on 08/29/2015 ------------------------------------------------ Attention: Normal Decision Making: Normal Language: Normal Memory: Normal Motor: Normal Perception: Normal Problem Solving: Normal Visual and Spatial: Normal   Family History:Reviewed  Family Health History Mother  Father, Deceased; Heart attack (myocardial infarction); Stroke (CVA);     Objective Information: General:Well appearing, well nourished in no distress. Heart:RRR, no murmur or gallop.  Normal S1, S2.  No S3, S4.  Lungs:  CTA bilaterally, no wheezes, rhonchi, rales.  Breathing unlabored.  Assessment:Anal cancer  Diagnoses: 173.59  C44.590 Carcinoma of skin of anus (Other specified  malignant neoplasm of anal skin)  Procedures: [id number] - OFFICE OUTPATIENT NEW 20 MINUTES    Plan:  Scheduled for portacath insertion on 08/30/15.   Patient Education:Alternative treatments to surgery were discussed with patient (and family).  Risks and benefits  of procedure including bleeding infection, and pneumothorax were fully explained to the patient (and family) who gave informed consent. Patient/family questions were addressed.  Follow-up:Pending Surgery

## 2015-08-30 NOTE — Anesthesia Postprocedure Evaluation (Signed)
Anesthesia Post Note  Patient: Lucrecia Abelar Cedrone  Procedure(s) Performed: Procedure(s) (LRB): INSERTION PORT-A-CATH LEFT SUBCLAVIAN (Left)  Patient location during evaluation: PACU Anesthesia Type: MAC Level of consciousness: awake and alert Pain management: pain level controlled Vital Signs Assessment: post-procedure vital signs reviewed and stable Respiratory status: spontaneous breathing Cardiovascular status: blood pressure returned to baseline Postop Assessment: no signs of nausea or vomiting Anesthetic complications: no    Last Vitals:  Filed Vitals:   08/30/15 0955 08/30/15 1000  BP: 130/89 133/87  Pulse:    Temp:    Resp: 11 31    Last Pain:  Filed Vitals:   08/30/15 1001  PainSc: 7                  Cornell Gaber

## 2015-08-30 NOTE — Interval H&P Note (Signed)
History and Physical Interval Note:  08/30/2015 9:34 AM  [place]  has presented today for surgery, with the diagnosis of anal cancer  The various methods of treatment have been discussed with the patient and family. After consideration of risks, benefits and other options for treatment, the patient has consented to  Procedure(s): INSERTION PORT-A-CATH (N/A) as a surgical intervention .  The patient's history has been reviewed, patient examined, no change in status, stable for surgery.  I have reviewed the patient's chart and labs.  Questions were answered to the patient's satisfaction.     Aviva Signs A

## 2015-08-30 NOTE — Op Note (Signed)
Patient:  Jillian Estrada  DOB:  [birth year]  MRN:  [id number]   Preop Diagnosis:  Anal carcinoma, need for central venous access  Postop Diagnosis:  Same  Procedure:  Port-A-Cath insertion  Surgeon:  Aviva Signs, M.D.  Anes:  Mac  Indications:  Patient is a 60 year old white female who is about to undergo chemotherapy for anal carcinoma. She needs central venous access. The risks and benefits of the procedure including bleeding, infection, and pneumothorax were fully explained to the patient, who gave informed consent.  Procedure note:  The patient was placed in the supine position. The left upper chest was prepped and draped using the usual sterile technique with DuraPrep. Surgical site confirmation was performed. 1% Xylocaine was used for local anesthesia.  While the patient was in Trendelenburg position, an incision was made below the left clavicle. Subcutaneous pocket was formed. A needle is advanced into the left subclavian vein using the Seldinger technique without difficulty. A guidewire was then advanced into the right atrium under fluoroscopic guidance. An introducer peel-away sheath were placed over the guidewire. The catheter was then inserted through the peel-away sheath the peel-away sheath was removed. The catheter was then attached to the port and the port placed in subcutaneous pocket. Adequate positioning was confirmed by fluoroscopy. Good backflow of blood was noted on aspiration of the port. The port was flushed with heparin flush. The subcutaneous layer was reapproximated using a 3-0 Vicryl interrupted suture. The skin was closed using a 4 Vicryl subcuticular suture. Dermabond was then applied.  All tape and needle counts were correct the end of the procedure. Patient was transferred to PACU in stable condition. A chest x-ray will be performed at that time.  Complications:  None  EBL:  Minimal  Specimen:  None

## 2015-08-30 NOTE — Anesthesia Preprocedure Evaluation (Signed)
Anesthesia Evaluation  Patient identified by MRN, date of birth, ID band Patient awake    Reviewed: Allergy & Precautions, NPO status , Patient's Chart, lab work & pertinent test results, reviewed documented beta blocker date and time   Airway Mallampati: III  TM Distance: >3 FB Neck ROM: Full    Dental  (+) Teeth Intact, Caps, Missing,    Pulmonary    Pulmonary exam normal        Cardiovascular hypertension, Pt. on medications and Pt. on home beta blockers Normal cardiovascular exam     Neuro/Psych PSYCHIATRIC DISORDERS Depression Bipolar Disorder    GI/Hepatic (+) Hepatitis -, C  Endo/Other    Renal/GU Renal disease     Musculoskeletal   Abdominal Normal abdominal exam  (+)   Peds  Hematology   Anesthesia Other Findings Anal Cancer  Reproductive/Obstetrics                             Anesthesia Physical Anesthesia Plan  ASA: III  Anesthesia Plan: MAC   Post-op Pain Management:    Induction: Intravenous  Airway Management Planned: Simple Face Mask  Additional Equipment:   Intra-op Plan:   Post-operative Plan:   Informed Consent: I have reviewed the patients History and Physical, chart, labs and discussed the procedure including the risks, benefits and alternatives for the proposed anesthesia with the patient or authorized representative who has indicated his/her understanding and acceptance.   Dental advisory given  Plan Discussed with: CRNA  Anesthesia Plan Comments:         Anesthesia Quick Evaluation

## 2015-08-30 NOTE — Transfer of Care (Signed)
Immediate Anesthesia Transfer of Care Note  Patient: Jillian Estrada  Procedure(s) Performed: Procedure(s): INSERTION PORT-A-CATH LEFT SUBCLAVIAN (Left)  Patient Location: PACU  Anesthesia Type:MAC  Level of Consciousness: awake, alert  and oriented  Airway & Oxygen Therapy: Patient Spontanous Breathing  Post-op Assessment: Report given to RN  Post vital signs: Reviewed and stable  Last Vitals:  Filed Vitals:   08/30/15 0955 08/30/15 1000  BP: 130/89 133/87  Pulse:    Temp:    Resp: 11 31    Complications: No apparent anesthesia complications

## 2015-09-02 ENCOUNTER — Encounter (HOSPITAL_COMMUNITY): Payer: Self-pay | Admitting: General Surgery

## 2015-09-02 ENCOUNTER — Other Ambulatory Visit (HOSPITAL_COMMUNITY): Payer: Self-pay | Admitting: Oncology

## 2015-09-02 ENCOUNTER — Other Ambulatory Visit (HOSPITAL_COMMUNITY): Payer: Self-pay | Admitting: Hematology & Oncology

## 2015-09-02 ENCOUNTER — Encounter
Admission: RE | Admit: 2015-09-02 | Discharge: 2015-09-02 | Disposition: A | Discharge status: Home / Self Care | Payer: Medicare Other | Source: Ambulatory Visit | Attending: Hematology & Oncology | Admitting: Hematology & Oncology

## 2015-09-02 DIAGNOSIS — C210 Malignant neoplasm of anus, unspecified: Secondary | ICD-10-CM | POA: Diagnosis present

## 2015-09-02 LAB — GLUCOSE, CAPILLARY: GLUCOSE-CAPILLARY: 95 mg/dL (ref 65–99)

## 2015-09-02 MED ORDER — FLUDEOXYGLUCOSE F - 18 (FDG) INJECTION
12.7800 | Freq: Once | INTRAVENOUS | Status: AC | PRN
  Administered 2015-09-02: 12.78 via INTRAVENOUS

## 2015-09-02 MED ORDER — PROCHLORPERAZINE MALEATE 10 MG PO TABS: 10.0000 mg | ORAL_TABLET | Freq: Four times a day (QID) | ORAL | Status: DC | PRN

## 2015-09-02 MED ORDER — ONDANSETRON HCL 8 MG PO TABS
8.0000 mg | ORAL_TABLET | Freq: Three times a day (TID) | ORAL | Status: AC | PRN
Start: 2015-09-02 — End: ?

## 2015-09-02 NOTE — Patient Instructions (Addendum)
[place]   CHEMOTHERAPY INSTRUCTIONS  Premeds: Zofran through your IV. This is for nausea/vomiting prevention/reduction. This takes 15 minutes to infuse.  Mitomycin - myelosuppression (low white blood cells, low red blood cells, low platelets)., nausea, vomiting, loss of appetite, hair loss, mouth sores or pain in mouth, kidney toxicity, lung toxicity, fatigue  5FU: bone marrow suppression (low white blood cells - wbcs fight infection, low red blood cells - rbcs make up your blood, low platelets - this is what makes your blood clot, nausea/vomiting, diarrhea, mouth sores, hair loss, dry skin, ocular toxicities (increased tear production, sensitivity to light). You must wear sunscreen/sunglasses. Cover your skin when out in sunlight. You will get burned very easily. This is the medication that will be attached to your ambulatory pump. You will wear the pump/5FU for 96 hours. Then the pump will be removed.   POTENTIAL SIDE EFFECTS OF TREATMENT: Increased Susceptibility to Infection, Vomiting, Constipation, Hair Thinning, Changes in Character of Skin and Nails (brittleness, dryness,etc.), Pigment Changes (darkening of veins, nail beds, palms of hands, soles of feet, etc.), Bone Marrow Suppression, Abdominal Cramping, Nausea, Diarrhea, Sun Sensitivity and Mouth Sores   EDUCATIONAL MATERIALS GIVEN AND REVIEWED: Chemotherapy and You booklet Specific Instructions Sheets: Mitomycin, 5FU, Zofran, Compazine, EMLA cream   SELF CARE ACTIVITIES WHILE ON CHEMOTHERAPY: Increase your fluid intake 48 hours prior to treatment and drink at least 2 quarts per day after treatment., No alcohol intake., No aspirin or other medications unless approved by your oncologist., Eat foods that are light and easy to digest., Eat foods at cold or room temperature., No fried, fatty, or spicy foods immediately before or after treatment., Have teeth cleaned professionally before starting  treatment. Keep dentures and partial plates clean., Use soft toothbrush and do not use mouthwashes that contain alcohol. Biotene is a good mouthwash that is available at most pharmacies or may be ordered by calling [phone]., Use warm salt water gargles (1 teaspoon salt per 1 quart warm water) before and after meals and at bedtime. Or you may rinse with 2 tablespoons of three -percent hydrogen peroxide mixed in eight ounces of water., Always use sunscreen with SPF (Sun Protection Factor) of 50 or higher., Use your nausea medication as directed to prevent nausea., Use your stool softener or laxative as directed to prevent constipation. and Use your anti-diarrheal medication as directed to stop diarrhea.  Please wash your hands for at least 30 seconds using warm soapy water. Handwashing is the #1 way to prevent the spread of germs. Stay away from sick people or people who are getting over a cold. If you develop respiratory systems such as green/yellow mucus production or productive cough or persistent cough let us know and we will see if you need an antibiotic. It is a good idea to keep a pair of gloves on when going into grocery stores/[employer] to decrease your risk of coming into contact with germs on the carts, etc. Carry alcohol hand gel with you at all times and use it frequently if out in public. All foods need to be cooked thoroughly. No raw foods. No medium or undercooked meats, eggs. If your food is cooked medium well, it does not need to be hot pink or saturated with bloody liquid at all. Vegetables and fruits need to be washed/rinsed under the faucet with a dish detergent before being consumed. You can eat raw fruits and vegetables unless we tell you otherwise but it would be best if  you cooked them or bought frozen. Do not eat off of salad bars or hot bars unless you really trust the cleanliness of the restaurant. If you need dental work, please let Dr. Whitney Muse know before you go for your  appointment so that we can coordinate the best possible time for you in regards to your chemo regimen. You need to also let your dentist know that you are actively taking chemo. We may need to do labs prior to your dental appointment. We also want your bowels moving at least every other day. If this is not happening, we need to know so that we can get you on a bowel regimen to help you go. If you are going to have sex, your partner must wear a condom. This should continue for 28 days post chemo completion.     MEDICATIONS: You have been given prescriptions for the following medications:  Zofran/Ondansetron 8mg  tablet. Take 1 tablet by mouth every 8 hours as needed for nausea/vomiting.   Compazine/Prochlorperazine 10mg  tablet. Take 1 tablet by mouth every 6 hours as needed for nausea/vomiting.     SYMPTOMS TO REPORT AS SOON AS POSSIBLE AFTER TREATMENT:  FEVER GREATER THAN 100.5 F  CHILLS WITH OR WITHOUT FEVER  NAUSEA AND VOMITING THAT IS NOT CONTROLLED WITH YOUR NAUSEA MEDICATION  UNUSUAL SHORTNESS OF BREATH  UNUSUAL BRUISING OR BLEEDING  TENDERNESS IN MOUTH AND THROAT WITH OR WITHOUT PRESENCE OF ULCERS  URINARY PROBLEMS  BOWEL PROBLEMS  UNUSUAL RASH    Wear comfortable clothing and clothing appropriate for easy access to any Portacath or PICC line. Let us know if there is anything that we can do to make your therapy better!      I have been informed and understand all of the instructions given to me and have received a copy. I have been instructed to call the clinic [phone] or my family physician as soon as possible for continued medical care, if indicated. I do not have any more questions at this time but understand that I may call the [place] or the Patient Navigator at [phone] during office hours should I have questions or need assistance in obtaining follow-up care.            Mitomycin injection What is this medicine? MITOMYCIN (mye toe  MYE sin) is a chemotherapy drug. This medicine is used to treat cancer of the stomach and pancreas. This medicine may be used for other purposes; ask your health care provider or pharmacist if you have questions. What should I tell my health care provider before I take this medicine? They need to know if you have any of these conditions: -anemia -bleeding disorder -infection (especially a virus infection such as chickenpox, cold sores, or herpes) -kidney disease -low blood counts like low platelets, red blood cells, white blood cells -recent radiation therapy -an unusual or allergic reaction to mitomycin, other chemotherapy agents, other medicines, foods, dyes, or preservatives -pregnant or trying to get pregnant -breast-feeding How should I use this medicine? This drug is given as an injection or infusion into a vein. It is administered in a hospital or clinic by a specially trained health care professional. Talk to your pediatrician regarding the use of this medicine in children. Special care may be needed. Overdosage: If you think you have taken too much of this medicine contact a poison control center or emergency room at once. NOTE: This medicine is only for you. Do not share this medicine with others. What  if I miss a dose? It is important not to miss your dose. Call your doctor or health care professional if you are unable to keep an appointment. What may interact with this medicine? -medicines to increase blood counts like filgrastim, pegfilgrastim, sargramostim -vaccines This list may not describe all possible interactions. Give your health care provider a list of all the medicines, herbs, non-prescription drugs, or dietary supplements you use. Also tell them if you smoke, drink alcohol, or use illegal drugs. Some items may interact with your medicine. What should I watch for while using this medicine? Your condition will be monitored carefully while you are receiving this medicine.  You will need important blood work done while you are taking this medicine. This drug may make you feel generally unwell. This is not uncommon, as chemotherapy can affect healthy cells as well as cancer cells. Report any side effects. Continue your course of treatment even though you feel ill unless your doctor tells you to stop. Call your doctor or health care professional for advice if you get a fever, chills or sore throat, or other symptoms of a cold or flu. Do not treat yourself. This drug decreases your body's ability to fight infections. Try to avoid being around people who are sick. This medicine may increase your risk to bruise or bleed. Call your doctor or health care professional if you notice any unusual bleeding. Be careful brushing and flossing your teeth or using a toothpick because you may get an infection or bleed more easily. If you have any dental work done, tell your dentist you are receiving this medicine. Avoid taking products that contain aspirin, acetaminophen, ibuprofen, naproxen, or ketoprofen unless instructed by your doctor. These medicines may hide a fever. Do not become pregnant while taking this medicine. Women should inform their doctor if they wish to become pregnant or think they might be pregnant. There is a potential for serious side effects to an unborn child. Talk to your health care professional or pharmacist for more information. Do not breast-feed an infant while taking this medicine. What side effects may I notice from receiving this medicine? Side effects that you should report to your doctor or health care professional as soon as possible: -allergic reactions like skin rash, itching or hives, swelling of the face, lips, or tongue -low blood counts - this medicine may decrease the number of white blood cells, red blood cells and platelets. You may be at increased risk for infections and bleeding. -signs of infection - fever or chills, cough, sore throat, pain or  difficulty passing urine -signs of decreased platelets or bleeding - bruising, pinpoint red spots on the skin, black, tarry stools, blood in the urine -signs of decreased red blood cells - unusually weak or tired, fainting spells, lightheadedness -breathing problems -changes in vision -chest pain -confusion -dry cough -high blood pressure -mouth sores -pain, swelling, redness at site where injected -pain, tingling, numbness in the hands or feet -seizures -swelling of the ankles, feet, hands -trouble passing urine or change in the amount of urine Side effects that usually do not require medical attention (report to your doctor or health care professional if they continue or are bothersome): -diarrhea -green to blue color of urine -hair loss -loss of appetite -nausea, vomiting This list may not describe all possible side effects. Call your doctor for medical advice about side effects. You may report side effects to FDA at 1-800-FDA-1088. Where should I keep my medicine? This drug is given in a  hospital or clinic and will not be stored at home. NOTE: This sheet is a summary. It may not cover all possible information. If you have questions about this medicine, talk to your doctor, pharmacist, or health care provider.    2016, Elsevier/[employer] Standard. (2007-11-03 11:16:23) Fluorouracil, 5-FU injection What is this medicine? FLUOROURACIL, 5-FU (flure oh YOOR a sil) is a chemotherapy drug. It slows the growth of cancer cells. This medicine is used to treat many types of cancer like breast cancer, colon or rectal cancer, pancreatic cancer, and stomach cancer. This medicine may be used for other purposes; ask your health care provider or pharmacist if you have questions. What should I tell my health care provider before I take this medicine? They need to know if you have any of these conditions: -blood disorders -dihydropyrimidine dehydrogenase (DPD) deficiency -infection (especially a  virus infection such as chickenpox, cold sores, or herpes) -kidney disease -liver disease -malnourished, poor nutrition -recent or ongoing radiation therapy -an unusual or allergic reaction to fluorouracil, other chemotherapy, other medicines, foods, dyes, or preservatives -pregnant or trying to get pregnant -breast-feeding How should I use this medicine? This drug is given as an infusion or injection into a vein. It is administered in a hospital or clinic by a specially trained health care professional. Talk to your pediatrician regarding the use of this medicine in children. Special care may be needed. Overdosage: If you think you have taken too much of this medicine contact a poison control center or emergency room at once. NOTE: This medicine is only for you. Do not share this medicine with others. What if I miss a dose? It is important not to miss your dose. Call your doctor or health care professional if you are unable to keep an appointment. What may interact with this medicine? -allopurinol -cimetidine -dapsone -digoxin -hydroxyurea -leucovorin -levamisole -medicines for seizures like ethotoin, fosphenytoin, phenytoin -medicines to increase blood counts like filgrastim, pegfilgrastim, sargramostim -medicines that treat or prevent blood clots like warfarin, enoxaparin, and dalteparin -methotrexate -metronidazole -pyrimethamine -some other chemotherapy drugs like busulfan, cisplatin, estramustine, vinblastine -trimethoprim -trimetrexate -vaccines Talk to your doctor or health care professional before taking any of these medicines: -acetaminophen -aspirin -ibuprofen -ketoprofen -naproxen This list may not describe all possible interactions. Give your health care provider a list of all the medicines, herbs, non-prescription drugs, or dietary supplements you use. Also tell them if you smoke, drink alcohol, or use illegal drugs. Some items may interact with your  medicine. What should I watch for while using this medicine? Visit your doctor for checks on your progress. This drug may make you feel generally unwell. This is not uncommon, as chemotherapy can affect healthy cells as well as cancer cells. Report any side effects. Continue your course of treatment even though you feel ill unless your doctor tells you to stop. In some cases, you may be given additional medicines to help with side effects. Follow all directions for their use. Call your doctor or health care professional for advice if you get a fever, chills or sore throat, or other symptoms of a cold or flu. Do not treat yourself. This drug decreases your body's ability to fight infections. Try to avoid being around people who are sick. This medicine may increase your risk to bruise or bleed. Call your doctor or health care professional if you notice any unusual bleeding. Be careful brushing and flossing your teeth or using a toothpick because you may get an infection or bleed more easily.  If you have any dental work done, tell your dentist you are receiving this medicine. Avoid taking products that contain aspirin, acetaminophen, ibuprofen, naproxen, or ketoprofen unless instructed by your doctor. These medicines may hide a fever. Do not become pregnant while taking this medicine. Women should inform their doctor if they wish to become pregnant or think they might be pregnant. There is a potential for serious side effects to an unborn child. Talk to your health care professional or pharmacist for more information. Do not breast-feed an infant while taking this medicine. Men should inform their doctor if they wish to father a child. This medicine may lower sperm counts. Do not treat diarrhea with over the counter products. Contact your doctor if you have diarrhea that lasts more than 2 days or if it is severe and watery. This medicine can make you more sensitive to the sun. Keep out of the sun. If you  cannot avoid being in the sun, wear protective clothing and use sunscreen. Do not use sun lamps or tanning beds/booths. What side effects may I notice from receiving this medicine? Side effects that you should report to your doctor or health care professional as soon as possible: -allergic reactions like skin rash, itching or hives, swelling of the face, lips, or tongue -low blood counts - this medicine may decrease the number of white blood cells, red blood cells and platelets. You may be at increased risk for infections and bleeding. -signs of infection - fever or chills, cough, sore throat, pain or difficulty passing urine -signs of decreased platelets or bleeding - bruising, pinpoint red spots on the skin, black, tarry stools, blood in the urine -signs of decreased red blood cells - unusually weak or tired, fainting spells, lightheadedness -breathing problems -changes in vision -chest pain -mouth sores -nausea and vomiting -pain, swelling, redness at site where injected -pain, tingling, numbness in the hands or feet -redness, swelling, or sores on hands or feet -stomach pain -unusual bleeding Side effects that usually do not require medical attention (report to your doctor or health care professional if they continue or are bothersome): -changes in finger or toe nails -diarrhea -dry or itchy skin -hair loss -headache -loss of appetite -sensitivity of eyes to the light -stomach upset -unusually teary eyes This list may not describe all possible side effects. Call your doctor for medical advice about side effects. You may report side effects to FDA at 1-800-FDA-1088. Where should I keep my medicine? This drug is given in a hospital or clinic and will not be stored at home. NOTE: This sheet is a summary. It may not cover all possible information. If you have questions about this medicine, talk to your doctor, pharmacist, or health care provider.    2016, [employer]/Gold Standard.  (2007-08-31 13:53:16)  Ondansetron injection What is this medicine? ONDANSETRON (on DAN se tron) is used to treat nausea and vomiting caused by chemotherapy. It is also used to prevent or treat nausea and vomiting after surgery. This medicine may be used for other purposes; ask your health care provider or pharmacist if you have questions. What should I tell my health care provider before I take this medicine? They need to know if you have any of these conditions: -heart disease -history of irregular heartbeat -liver disease -low levels of magnesium or potassium in the blood -an unusual or allergic reaction to ondansetron, granisetron, other medicines, foods, dyes, or preservatives -pregnant or trying to get pregnant -breast-feeding How should I use this medicine? This  medicine is for infusion into a vein. It is given by a health care professional in a hospital or clinic setting. Talk to your pediatrician regarding the use of this medicine in children. Special care may be needed. Overdosage: If you think you have taken too much of this medicine contact a poison control center or emergency room at once. NOTE: This medicine is only for you. Do not share this medicine with others. What if I miss a dose? This does not apply. What may interact with this medicine? Do not take this medicine with any of the following medications: -apomorphine -certain medicines for fungal infections like fluconazole, itraconazole, ketoconazole, posaconazole, voriconazole -cisapride -dofetilide -dronedarone -pimozide -thioridazine -ziprasidone This medicine may also interact with the following medications: -carbamazepine -certain medicines for depression, anxiety, or psychotic disturbances -fentanyl -linezolid -MAOIs like Carbex, Eldepryl, Marplan, Nardil, and Parnate -methylene blue (injected into a vein) -other medicines that prolong the QT interval (cause an abnormal heart  rhythm) -phenytoin -rifampicin -tramadol This list may not describe all possible interactions. Give your health care provider a list of all the medicines, herbs, non-prescription drugs, or dietary supplements you use. Also tell them if you smoke, drink alcohol, or use illegal drugs. Some items may interact with your medicine. What should I watch for while using this medicine? Your condition will be monitored carefully while you are receiving this medicine. What side effects may I notice from receiving this medicine? Side effects that you should report to your doctor or health care professional as soon as possible: -allergic reactions like skin rash, itching or hives, swelling of the face, lips, or tongue -breathing problems -confusion -dizziness -fast or irregular heartbeat -feeling faint or lightheaded, falls -fever and chills -loss of balance or coordination -seizures -sweating -swelling of the hands and feet -tightness in the chest -tremors -unusually weak or tired Side effects that usually do not require medical attention (report to your doctor or health care professional if they continue or are bothersome): -constipation or diarrhea -headache This list may not describe all possible side effects. Call your doctor for medical advice about side effects. You may report side effects to FDA at 1-800-FDA-1088. Where should I keep my medicine? This drug is given in a hospital or clinic and will not be stored at home. NOTE: This sheet is a summary. It may not cover all possible information. If you have questions about this medicine, talk to your doctor, pharmacist, or health care provider.    2016, [employer] Standard. (2013-02-01 16:18:28) Prochlorperazine tablets What is this medicine? PROCHLORPERAZINE (proe klor PER a zeen) helps to control severe nausea and vomiting. This medicine is also used to treat schizophrenia. It can also help patients who experience anxiety that is not  due to psychological illness. This medicine may be used for other purposes; ask your health care provider or pharmacist if you have questions. What should I tell my health care provider before I take this medicine? They need to know if you have any of these conditions: -blood disorders or disease -dementia -liver disease or jaundice -Parkinson's disease -uncontrollable movement disorder -an unusual or allergic reaction to prochlorperazine, other medicines, foods, dyes, or preservatives -pregnant or trying to get pregnant -breast-feeding How should I use this medicine? Take this medicine by mouth with a glass of water. Follow the directions on the prescription label. Take your doses at regular intervals. Do not take your medicine more often than directed. Do not stop taking this medicine suddenly. This can cause nausea, vomiting, and  dizziness. Ask your doctor or health care professional for advice. Talk to your pediatrician regarding the use of this medicine in children. Special care may be needed. While this drug may be prescribed for children as young as 2 years for selected conditions, precautions do apply. Overdosage: If you think you have taken too much of this medicine contact a poison control center or emergency room at once. NOTE: This medicine is only for you. Do not share this medicine with others. What if I miss a dose? If you miss a dose, take it as soon as you can. If it is almost time for your next dose, take only that dose. Do not take double or extra doses. What may interact with this medicine? Do not take this medicine with any of the following medications: -amoxapine -antidepressants like citalopram, escitalopram, fluoxetine, paroxetine, and sertraline -deferoxamine -dofetilide -maprotiline -tricyclic antidepressants like amitriptyline, clomipramine, imipramine, nortiptyline and others This medicine may also interact with the following medications: -lithium -medicines  for pain -phenytoin -propranolol -warfarin This list may not describe all possible interactions. Give your health care provider a list of all the medicines, herbs, non-prescription drugs, or dietary supplements you use. Also tell them if you smoke, drink alcohol, or use illegal drugs. Some items may interact with your medicine. What should I watch for while using this medicine? Visit your doctor or health care professional for regular checks on your progress. You may get drowsy or dizzy. Do not drive, use machinery, or do anything that needs mental alertness until you know how this medicine affects you. Do not stand or sit up quickly, especially if you are an older patient. This reduces the risk of dizzy or fainting spells. Alcohol may interfere with the effect of this medicine. Avoid alcoholic drinks. This medicine can reduce the response of your body to heat or cold. Dress warm in cold weather and stay hydrated in hot weather. If possible, avoid extreme temperatures like saunas, hot tubs, very hot or cold showers, or activities that can cause dehydration such as vigorous exercise. This medicine can make you more sensitive to the sun. Keep out of the sun. If you cannot avoid being in the sun, wear protective clothing and use sunscreen. Do not use sun lamps or tanning beds/booths. Your mouth may get dry. Chewing sugarless gum or sucking hard candy, and drinking plenty of water may help. Contact your doctor if the problem does not go away or is severe. What side effects may I notice from receiving this medicine? Side effects that you should report to your doctor or health care professional as soon as possible: -blurred vision -breast enlargement in men or women -breast milk in women who are not breast-feeding -chest pain, fast or irregular heartbeat -confusion, restlessness -dark yellow or brown urine -difficulty breathing or swallowing -dizziness or fainting spells -drooling, shaking, movement  difficulty (shuffling walk) or rigidity -fever, chills, sore throat -involuntary or uncontrollable movements of the eyes, mouth, head, arms, and legs -seizures -stomach area pain -unusually weak or tired -unusual bleeding or bruising -yellowing of skin or eyes Side effects that usually do not require medical attention (report to your doctor or health care professional if they continue or are bothersome): -difficulty passing urine -difficulty sleeping -headache -sexual dysfunction -skin rash, or itching This list may not describe all possible side effects. Call your doctor for medical advice about side effects. You may report side effects to FDA at 1-800-FDA-1088. Where should I keep my medicine? Keep out of the reach  of children. Store at room temperature between 15 and 30 degrees C (59 and 86 degrees F). Protect from light. Throw away any unused medicine after the expiration date. NOTE: This sheet is a summary. It may not cover all possible information. If you have questions about this medicine, talk to your doctor, pharmacist, or health care provider.    2016, Elsevier/[employer] Standard. (2011-09-15 16:59:39) Ondansetron tablets What is this medicine? ONDANSETRON (on DAN se tron) is used to treat nausea and vomiting caused by chemotherapy. It is also used to prevent or treat nausea and vomiting after surgery. This medicine may be used for other purposes; ask your health care provider or pharmacist if you have questions. What should I tell my health care provider before I take this medicine? They need to know if you have any of these conditions: -heart disease -history of irregular heartbeat -liver disease -low levels of magnesium or potassium in the blood -an unusual or allergic reaction to ondansetron, granisetron, other medicines, foods, dyes, or preservatives -pregnant or trying to get pregnant -breast-feeding How should I use this medicine? Take this medicine by mouth with a  glass of water. Follow the directions on your prescription label. Take your doses at regular intervals. Do not take your medicine more often than directed. Talk to your pediatrician regarding the use of this medicine in children. Special care may be needed. Overdosage: If you think you have taken too much of this medicine contact a poison control center or emergency room at once. NOTE: This medicine is only for you. Do not share this medicine with others. What if I miss a dose? If you miss a dose, take it as soon as you can. If it is almost time for your next dose, take only that dose. Do not take double or extra doses. What may interact with this medicine? Do not take this medicine with any of the following medications: -apomorphine -certain medicines for fungal infections like fluconazole, itraconazole, ketoconazole, posaconazole, voriconazole -cisapride -dofetilide -dronedarone -pimozide -thioridazine -ziprasidone This medicine may also interact with the following medications: -carbamazepine -certain medicines for depression, anxiety, or psychotic disturbances -fentanyl -linezolid -MAOIs like Carbex, Eldepryl, Marplan, Nardil, and Parnate -methylene blue (injected into a vein) -other medicines that prolong the QT interval (cause an abnormal heart rhythm) -phenytoin -rifampicin -tramadol This list may not describe all possible interactions. Give your health care provider a list of all the medicines, herbs, non-prescription drugs, or dietary supplements you use. Also tell them if you smoke, drink alcohol, or use illegal drugs. Some items may interact with your medicine. What should I watch for while using this medicine? Check with your doctor or health care professional right away if you have any sign of an allergic reaction. What side effects may I notice from receiving this medicine? Side effects that you should report to your doctor or health care professional as soon as  possible: -allergic reactions like skin rash, itching or hives, swelling of the face, lips or tongue -breathing problems -confusion -dizziness -fast or irregular heartbeat -feeling faint or lightheaded, falls -fever and chills -loss of balance or coordination -seizures -sweating -swelling of the hands or feet -tightness in the chest -tremors -unusually weak or tired Side effects that usually do not require medical attention (report to your doctor or health care professional if they continue or are bothersome): -constipation or diarrhea -headache This list may not describe all possible side effects. Call your doctor for medical advice about side effects. You may report side effects  to FDA at 1-800-FDA-1088. Where should I keep my medicine? Keep out of the reach of children. Store between 2 and 30 degrees C (36 and 86 degrees F). Throw away any unused medicine after the expiration date. NOTE: This sheet is a summary. It may not cover all possible information. If you have questions about this medicine, talk to your doctor, pharmacist, or health care provider.    2016, Elsevier/Gold Standard. (2013-02-01 16:27:45) Lidocaine; Prilocaine cream What is this medicine? LIDOCAINE; PRILOCAINE (LYE doe kane; PRIL oh kane) is a topical anesthetic that causes loss of feeling in the skin and surrounding tissues. It is used to numb the skin before procedures or injections. This medicine may be used for other purposes; ask your health care provider or pharmacist if you have questions. What should I tell my health care provider before I take this medicine? They need to know if you have any of these conditions: -glucose-6-phosphate deficiencies -heart disease -kidney or liver disease -methemoglobinemia -an unusual or allergic reaction to lidocaine, prilocaine, other medicines, foods, dyes, or preservatives -pregnant or trying to get pregnant -breast-feeding How should I use this medicine? This  medicine is for external use only on the skin. Do not take by mouth. Follow the directions on the prescription label. Wash hands before and after use. Do not use more or leave in contact with the skin longer than directed. Do not apply to eyes or open wounds. It can cause irritation and blurred or temporary loss of vision. If this medicine comes in contact with your eyes, immediately rinse the eye with water. Do not touch or rub the eye. Contact your health care provider right away. Talk to your pediatrician regarding the use of this medicine in children. While this medicine may be prescribed for children for selected conditions, precautions do apply. Overdosage: If you think you have taken too much of this medicine contact a poison control center or emergency room at once. NOTE: This medicine is only for you. Do not share this medicine with others. What if I miss a dose? This medicine is usually only applied once prior to each procedure. It must be in contact with the skin for a period of time for it to work. If you applied this medicine later than directed, tell your health care professional before starting the procedure. What may interact with this medicine? -acetaminophen -chloroquine -dapsone -medicines to control heart rhythm -nitrates like nitroglycerin and nitroprusside -other ointments, creams, or sprays that may contain anesthetic medicine -phenobarbital -phenytoin -quinine -sulfonamides like sulfacetamide, sulfamethoxazole, sulfasalazine and others This list may not describe all possible interactions. Give your health care provider a list of all the medicines, herbs, non-prescription drugs, or dietary supplements you use. Also tell them if you smoke, drink alcohol, or use illegal drugs. Some items may interact with your medicine. What should I watch for while using this medicine? Be careful to avoid injury to the treated area while it is numb and you are not aware of pain. Avoid  scratching, rubbing, or exposing the treated area to hot or cold temperatures until complete sensation has returned. The numb feeling will wear off a few hours after applying the cream. What side effects may I notice from receiving this medicine? Side effects that you should report to your doctor or health care professional as soon as possible: -blurred vision -chest pain -difficulty breathing -dizziness -drowsiness -fast or irregular heartbeat -skin rash or itching -swelling of your throat, lips, or face -trembling Side effects that usually  do not require medical attention (report to your doctor or health care professional if they continue or are bothersome): -changes in ability to feel hot or cold -redness and swelling at the application site This list may not describe all possible side effects. Call your doctor for medical advice about side effects. You may report side effects to FDA at 1-800-FDA-1088. Where should I keep my medicine? Keep out of reach of children. Store at room temperature between 15 and 30 degrees C (59 and 86 degrees F). Keep container tightly closed. Throw away any unused medicine after the expiration date. NOTE: This sheet is a summary. It may not cover all possible information. If you have questions about this medicine, talk to your doctor, pharmacist, or health care provider.    2016, Elsevier/Gold Standard. (2007-10-31 17:14:35)

## 2015-09-03 ENCOUNTER — Encounter (HOSPITAL_COMMUNITY): Payer: Medicare Other

## 2015-09-03 ENCOUNTER — Encounter (HOSPITAL_BASED_OUTPATIENT_CLINIC_OR_DEPARTMENT_OTHER): Payer: Medicare Other | Admitting: Hematology & Oncology

## 2015-09-03 ENCOUNTER — Encounter: Payer: Self-pay | Admitting: *Deleted

## 2015-09-03 VITALS — BP 146/83 | HR 73 | Temp 98.9°F | Resp 18 | Wt 150.0 lb

## 2015-09-03 DIAGNOSIS — C210 Malignant neoplasm of anus, unspecified: Secondary | ICD-10-CM

## 2015-09-03 DIAGNOSIS — G893 Neoplasm related pain (acute) (chronic): Secondary | ICD-10-CM | POA: Diagnosis not present

## 2015-09-03 DIAGNOSIS — Z8619 Personal history of other infectious and parasitic diseases: Secondary | ICD-10-CM

## 2015-09-03 MED ORDER — LIDOCAINE-PRILOCAINE 2.5-2.5 % EX CREA: TOPICAL_CREAM | CUTANEOUS | Status: AC

## 2015-09-03 MED ORDER — OXYCODONE HCL ER 30 MG PO T12A: 30.0000 mg | EXTENDED_RELEASE_TABLET | Freq: Two times a day (BID) | ORAL | Status: DC

## 2015-09-03 NOTE — Progress Notes (Signed)
[facility] Psychosocial Distress Screening Clinical Social Work  Clinical Social Work met with pt and her husband, Iona Beard at chemo teaching to introduce self, explain role of CSW and to was review distress screening protocol.  The patient scored a 5 on the Psychosocial Distress Thermometer which indicates moderate distress. Clinical Social Worker reviewed screen with pt to assess for distress and other psychosocial needs.  Pt reports she has had depression since 2012 and sees a psychiatrist and therapist regularly. They are both aware of her new cancer diagnosis and she plans to continue to see them regularly. CSW reviewed common emotions, such as anxiety with new cancer diagnosis. Pt currently has some concerns about her spirituality and is open to meeting with chaplain for additional support at future visits. Pt open to attending support programs at [facility] and was provided with those resources today. Pt has CSW contact info as well and agrees to reach out as needed.   ONCBCN DISTRESS SCREENING 09/03/2015  Screening Type Initial Screening  Distress experienced in past week (1-10) 5  Emotional problem type Depression;Nervousness/Anxiety;Adjusting to illness  Spiritual/Religous concerns type Relating to God  Physical Problem type Pain;Sleep/insomnia;Constipation/diarrhea  Physician notified of physical symptoms Yes  Referral to clinical social work Yes  Referral to support programs Yes  Other referral to chaplain for spiritual support as well.     Clinical Social Worker follow up needed: Yes.    If yes, follow up plan: See above Loren Racer, [place] Worker [place]  [facility] Phone: [phone] Fax: [phone]

## 2015-09-03 NOTE — Progress Notes (Signed)
Chemo teaching done and consent signed for Mitomycin and 5FU. Distress screening done by Abby Potash CSW. Meds called into patient's pharmacy. 84fu pump paperwork completed online. Chemo calendar given to patient.

## 2015-09-03 NOTE — Progress Notes (Signed)
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   08/15/2015 Pathology Results Invasive squamous cell carcinoma with involvement of multiple margins   08/21/2015 Imaging CT CAP- mild diffuse distal rectal wall thickening possibly representing recently diagnosed malignancy, borderline L pelvic and bilateral inguinal adenopathy   08/27/2015 Procedure Colonoscopy with Dr. Oneida Alar   09/02/2015 PET scan intense malignant range FDG uptake associated with the anal mass, enlarged and hypermetabolic L inguinal LN, cannot rule out metastatic adenopathy. nodule in R lobe of thyroid 2.5 cm no significant FDG uptake    HISTORY OF PRESENTING ILLNESS:  Jillian Estrada 60 y.o. 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  Jillian Estrada returns to the [place] today accompanied by her husband.  She notes that this morning was not a good morning, and she cancelled a foot doctor appointment.  In terms of constipation, she notes that she went a week and only went to the bathroom once, but notes she was "cleaned out" from the colonoscopy. She comments that she's been taking her Miralax and stool softener. She was advised that she can increase her Miralax to twice a day.  She will be seeing Dr. Lisbeth Renshaw on Thursday, and then gets started on May 8th with XRT.  Today she says, with regards to her cancer, "what have I done so wrong that other people haven't done?" And her husband remarks "her self esteem is really low." She wonders if her disease is karma, and was advised not to think of it that  way.  Both Jillian Estrada and her husband say they don't have any questions today, but that they are "happy to see you guys."  MEDICAL HISTORY:  Past Medical History  Diagnosis Date  . Depression     ?Bipolar, sees psychiatrist, Sees Dr. Riley Churches in [place]. Hospitalized a few times, Feb 2013 and Aug 2015.   Marland Kitchen Hypertension   . Anal cancer ([place])   . Kidney failure     Lithium overdose unintentional, now resolved  . Hepatitis C     treated through [facility]  . 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 Anal cancer ([place])   . Cancer [facility])     Anal    SURGICAL HISTORY: Past Surgical History  Procedure Laterality Date  . Back surgery      Nov 2008, Aug 2011  . Small bowel repair      June 2010. Injury to small bowel during ovarian cyst surgery  . Foreign object removal      Aug 2010, surgical tool left during ovarian surgery  . Colonoscopy  2010    [place]: non-bleeding internal hemorrhoids   . Rectal biopsy    . Colonoscopy with propofol N/A 08/27/2015    Procedure: COLONOSCOPY WITH PROPOFOL;  Surgeon: Danie Binder, MD;  Location: [facility] ENDO SUITE;  Service: Endoscopy;  Laterality: N/A;  915  . Portacath placement Left 08/30/2015    Procedure: INSERTION PORT-A-CATH LEFT SUBCLAVIAN;  Surgeon: Aviva Signs, MD;  Location: [facility] ORS;  Service: General;  Laterality: Left;    SOCIAL HISTORY: Social History  Social History  . Marital Status: Married    Spouse Name: N/A  . Number of Children: N/A  . Years of Education: N/A   Occupational History  . retired     worked 56 years for [employer]  . Smoking status: Never Smoker   . Smokeless tobacco: Not on file  . Alcohol Use: 0.0 oz/week    0 Standard drinks or equivalent per week     Comment: occasionally  . Drug Use: No  . Sexual Activity: Not on file   Other Topics Concern  . Not on file   Social History Narrative   Married; husband Iona Beard. 34 years  married. He has two children; she has step children. 1 step grand-child. Born in [place], [place]. Lived in [place]. Now live in [place], [place] She says she has 5 dogs; pomeranians and a chihuahua.  Other than this, she says she has "no hobbies." Her husband builds show cars, which she assists with. She notes that she doesn't garden. Non-smoker, quit in 2001. Her husband quit two years ago. She drinks occasionally, noting "a beer."  FAMILY HISTORY: Family History  Problem Relation Age of Onset  . Colon polyps Father   . Colon cancer Neg Hx    Father is deceased, October 18, 2012. He had heart attacks and a brain bleed from a stroke. Mother 45, lives in [place]; "never wants me to come see her." Says her mother says she can't muster up the strength to have visitors. Had a sister, who was killed in an automobile accident. 1 adopted brother as well.  ALLERGIES:  is allergic to penicillins and codeine.  MEDICATIONS:  Current Outpatient Prescriptions  Medication Sig Dispense Refill  . alendronate (FOSAMAX) 70 MG tablet Take 70 mg by mouth once a week. Take with a full glass of water on an empty stomach.(Tuesday)    . allopurinol (ZYLOPRIM) 100 MG tablet Take 100 mg by mouth daily.    . ARIPiprazole (ABILIFY) 15 MG tablet Take 15 mg by mouth daily.    Marland Kitchen aspirin EC 81 MG tablet Take 1 tablet (81 mg total) by mouth daily. For prevention of stroke and heart attack 1 tablet 0  . Biotin 5000 MCG CAPS Take 1 capsule by mouth 3 (three) times daily.     . cetirizine (ZYRTEC) 10 MG tablet Take 10 mg by mouth daily.    . Cholecalciferol (VITAMIN D) 2000 UNITS CAPS Take 1 capsule (2,000 Units total) by mouth every morning. For Vitamin D replacement and mood control. 30 capsule 0  . clonazePAM (KLONOPIN) 0.5 MG tablet Take 0.5 mg by mouth at bedtime.     . cyclobenzaprine (FLEXERIL) 10 MG tablet Take 1 tablet (10 mg total) by mouth 3 (three) times daily as needed. For pain 30 tablet 0  . docusate sodium  (COLACE) 100 MG capsule Take 2 capsules (200 mg total) by mouth every morning. For constipation 10 capsule 0  . [START ON 09/16/2015] fluorouracil CALGB 09811 in sodium chloride 0.9 % 150 mL Inject into the vein over 96 hr. To infuse Days 1-5 and Days 28-32    . lidocaine-prilocaine (EMLA) cream Apply a quarter size amount to port site 1 hour prior to chemo. Do not rub in. Cover with plastic wrap. 30 g 3  . metoprolol tartrate (LOPRESSOR) 25 MG tablet Take 25 mg by mouth 2 (two) times daily.    Derrill Memo ON 09/16/2015] MITOMYCIN IV Inject into the  vein. Day 1 and Day 28    . ondansetron (ZOFRAN) 8 MG tablet Take 1 tablet (8 mg total) by mouth every 8 (eight) hours as needed for nausea or vomiting. 30 tablet 2  . oxyCODONE (OXYCONTIN) 30 MG 12 hr tablet Take 30 mg by mouth 2 (two) times daily. 60 each 0  . oxyCODONE (ROXICODONE) 15 MG immediate release tablet Take 1 tablet (15 mg total) by mouth every 4 (four) hours as needed for pain. 90 tablet 0  . oxyCODONE-acetaminophen (PERCOCET/ROXICET) 5-325 MG tablet 1 or 2 tabs PO q6h prn pain 20 tablet 0  . prochlorperazine (COMPAZINE) 10 MG tablet TAKE 1 TABLET(10 MG) BY MOUTH EVERY 6 HOURS AS NEEDED FOR NAUSEA OR VOMITING 385 tablet 2  . sertraline (ZOLOFT) 100 MG tablet Take 150 mg by mouth daily.    Marland Kitchen tolterodine (DETROL) 2 MG tablet Take 2 mg by mouth daily as needed (bladder). Reported on 08/27/2015    . venlafaxine XR (EFFEXOR-XR) 150 MG 24 hr capsule Take 150 mg by mouth daily with breakfast.     No current facility-administered medications for this visit.    Review of Systems  Constitutional: Negative.   HENT: Negative.   Eyes: Negative.   Respiratory: Negative.   Cardiovascular: Negative.   Gastrointestinal: Negative.        Pain in the anal region associated with lesion.  Genitourinary: Negative.   Musculoskeletal: Positive for back pain.       Chronic bad back.  Skin: Negative.   Neurological: Negative.   Endo/Heme/Allergies: Negative.    Psychiatric/Behavioral: Negative.   All other systems reviewed and are negative.  14 point ROS was done and is otherwise as detailed above or in HPI    PHYSICAL EXAMINATION: ECOG PERFORMANCE STATUS: 1 - Symptomatic but completely ambulatory  Filed Vitals:   09/03/15 1100  BP: 146/83  Pulse: 73  Temp: 98.9 F (37.2 C)  Resp: 18   Filed Weights   09/03/15 1100  Weight: 150 lb (68.04 kg)    Physical Exam  Constitutional: She is oriented to person, place, and time and well-developed, well-nourished, and in no distress.  HENT:  Head: Normocephalic and atraumatic.  Nose: Nose normal.  Mouth/Throat: Oropharynx is clear and moist. No oropharyngeal exudate.  Eyes: Conjunctivae and EOM are normal. Pupils are equal, round, and reactive to light. Right eye exhibits no discharge. Left eye exhibits no discharge. No scleral icterus.  Neck: Normal range of motion. Neck supple. No tracheal deviation present. No thyromegaly present.  Cardiovascular: Normal rate, regular rhythm and normal heart sounds.  Exam reveals no gallop and no friction rub.   No murmur heard. Port a cath with mild bruising but C/D/I Pulmonary/Chest: Effort normal and breath sounds normal. She has no wheezes. She has no rales.  Abdominal: Soft. Bowel sounds are normal. She exhibits no distension and no mass. There is no tenderness. There is no rebound and no guarding.  Genitourinary:  Rectal exam deferred secondary to patient's pain  Musculoskeletal: Normal range of motion. She exhibits no edema.  Lymphadenopathy:    She has no cervical adenopathy.       Left: Inguinal adenopathy present.  L side palpable lymph node.  Neurological: She is alert and oriented to person, place, and time. She has normal reflexes. No cranial nerve deficit. Gait normal. Coordination normal.  Skin: Skin is warm and dry. No rash noted.  Psychiatric: Mood, memory, affect and judgment normal.  Nursing note and vitals reviewed.  LABORATORY  DATA:  I have reviewed the data as listed Lab Results  Component Value Date   WBC 9.3 08/25/2015   HGB 13.7 08/25/2015   HCT 40.3 08/25/2015   MCV 91.4 08/25/2015   PLT 244 08/25/2015   CMP     Component Value Date/Time   NA 142 08/25/2015 1145   K 4.6 08/25/2015 1145   CL 107 08/25/2015 1145   CO2 27 08/25/2015 1145   GLUCOSE 98 08/25/2015 1145   BUN 14 08/25/2015 1145   CREATININE 0.86 08/25/2015 1145   CALCIUM 9.1 08/25/2015 1145   PROT 7.5 08/25/2015 1145   ALBUMIN 3.9 08/25/2015 1145   AST 26 08/25/2015 1145   ALT 19 08/25/2015 1145   ALKPHOS 62 08/25/2015 1145   BILITOT 0.3 08/25/2015 1145   GFRNONAA >60 08/25/2015 1145   GFRAA >60 08/25/2015 1145     RADIOGRAPHIC STUDIES: I have personally reviewed the radiological images as listed and agreed with the findings in the report.  Ct Chest W Contrast  08/21/2015  CLINICAL DATA:  Hemorrhoids removal 08/15/2015. Biopsy came back cancer. Anal cancer, staging. EXAM: CT CHEST, ABDOMEN, AND PELVIS WITH CONTRAST TECHNIQUE: Multidetector CT imaging of the chest, abdomen and pelvis was performed following the standard protocol during bolus administration of intravenous contrast. CONTRAST:  181mL ISOVUE-300 IOPAMIDOL (ISOVUE-300) INJECTION 61% COMPARISON:  None. FINDINGS: CT CHEST FINDINGS Mediastinum/Lymph Nodes: No masses, pathologically enlarged lymph nodes, or other significant abnormality. Heart is normal size. Aorta is normal caliber. Lungs/Pleura: Linear areas of scarring or atelectasis in the right middle lobe, lingula, and right lower lobe. No pulmonary nodules. No pleural effusions. No confluent airspace opacities. Musculoskeletal: No chest wall mass or suspicious bone lesions identified. Nodule within the right thyroid gland measuring up to 3.6 cm. CT ABDOMEN PELVIS FINDINGS Hepatobiliary: No masses or other significant abnormality. Pancreas: No mass, inflammatory changes, or other significant abnormality. Spleen: Within normal  limits in size and appearance. Adrenals/Urinary Tract: No masses identified. No evidence of hydronephrosis. Stomach/Bowel: No evidence of obstruction, inflammatory process, or abnormal fluid collections. Vascular/Lymphatic: No pathologically enlarged lymph nodes. No evidence of abdominal aortic aneurysm. Reproductive: No mass or other significant abnormality. Prior tubal ligation. Other: No free fluid or free air. Musculoskeletal:  No suspicious bone lesions identified. IMPRESSION: No evidence of metastatic disease in the chest, abdomen or pelvis. No acute findings. Electronically Signed   By: Rolm Baptise M.D.   On: 08/21/2015 15:30   Ct Pelvis W Contrast  08/25/2015  CLINICAL DATA:  Rectal pain. Recent rectal biopsy, diagnosed with cancer. EXAM: CT PELVIS WITHOUT CONTRAST TECHNIQUE: Multidetector CT imaging of the pelvis was performed following the standard protocol without intravenous contrast. COMPARISON:  None. FINDINGS: Urinary Tract:  Normal appearing urinary bladder and distal ureters. Bowel: Mild distal rectal wall thickening. No fluid or gas collections. The remainder the included colon and small bowel have normal appearances. Normal appearing appendix. Vascular/Lymphatic: Prominent bilateral inguinal lymph nodes with little change. The largest node on the left has a short axis diameter of 11 mm on image number 39 and the largest node on the right has a short axis diameter of 9 mm on image number 39. 6 mm short axis left internal iliac lymph node on image number 27 of series 2. There is also a 9 mm short axis left obturator node on image number 28. The corresponding obturator node on the right has a 7 mm short axis diameter on image number 28. A 7 mm short axis left  external iliac node on image number 26. Mild atheromatous arterial calcifications. Reproductive: 1.2 cm calcified uterine fibroid. Bilateral tubal ligation clips. Normal appearing ovaries. Other:  Small umbilical hernia containing fat.  Musculoskeletal: Lower lumbar spine degenerative changes with disc bulges. IMPRESSION: 1. Mild diffuse distal rectal wall thickening, possibly representing the recently diagnosed malignancy. 2. Borderline left pelvic and bilateral inguinal adenopathy. This could potentially represent metastatic adenopathy. 3. No acute abnormality. Electronically Signed   By: Claudie Revering M.D.   On: 08/25/2015 15:16   Ct Abdomen Pelvis W Contrast  08/21/2015  CLINICAL DATA:  Hemorrhoids removal 08/15/2015. Biopsy came back cancer. Anal cancer, staging. EXAM: CT CHEST, ABDOMEN, AND PELVIS WITH CONTRAST TECHNIQUE: Multidetector CT imaging of the chest, abdomen and pelvis was performed following the standard protocol during bolus administration of intravenous contrast. CONTRAST:  119mL ISOVUE-300 IOPAMIDOL (ISOVUE-300) INJECTION 61% COMPARISON:  None. FINDINGS: CT CHEST FINDINGS Mediastinum/Lymph Nodes: No masses, pathologically enlarged lymph nodes, or other significant abnormality. Heart is normal size. Aorta is normal caliber. Lungs/Pleura: Linear areas of scarring or atelectasis in the right middle lobe, lingula, and right lower lobe. No pulmonary nodules. No pleural effusions. No confluent airspace opacities. Musculoskeletal: No chest wall mass or suspicious bone lesions identified. Nodule within the right thyroid gland measuring up to 3.6 cm. CT ABDOMEN PELVIS FINDINGS Hepatobiliary: No masses or other significant abnormality. Pancreas: No mass, inflammatory changes, or other significant abnormality. Spleen: Within normal limits in size and appearance. Adrenals/Urinary Tract: No masses identified. No evidence of hydronephrosis. Stomach/Bowel: No evidence of obstruction, inflammatory process, or abnormal fluid collections. Vascular/Lymphatic: No pathologically enlarged lymph nodes. No evidence of abdominal aortic aneurysm. Reproductive: No mass or other significant abnormality. Prior tubal ligation. Other: No free fluid or free  air. Musculoskeletal:  No suspicious bone lesions identified. IMPRESSION: No evidence of metastatic disease in the chest, abdomen or pelvis. No acute findings. Electronically Signed   By: Rolm Baptise M.D.   On: 08/21/2015 15:30   Nm Pet Image Initial (pi) Skull Base To Thigh  09/02/2015  CLINICAL DATA:  Initial treatment strategy for primary anal squamous cell carcinoma. EXAM: NUCLEAR MEDICINE PET SKULL BASE TO THIGH TECHNIQUE: 12.78 mCi F-18 FDG was injected intravenously. Full-ring PET imaging was performed from the skull base to thigh after the radiotracer. CT data was obtained and used for attenuation correction and anatomic localization. FASTING BLOOD GLUCOSE:  Value: 95 mg/dl COMPARISON:  None. FINDINGS: NECK No hypermetabolic lymph nodes in the neck. Nodule in the right lobe of thyroid gland measures 2.5 cm, image 63 of series 3. No significant FDG uptake associated with this nodule. CHEST No hypermetabolic mediastinal or hilar nodes. No suspicious pulmonary nodules on the CT scan. ABDOMEN/PELVIS No abnormal hypermetabolic activity within the liver, pancreas, adrenal glands, or spleen. Hypermetabolic mass involving the anus measures approximately 3.5 by 3.3 by 3.4 cm and has an SUV max equal to 12.16. Left pelvic side wall lymph node Measures 9 mm and has an SUV max equal to 3 point IIIa. Left inguinal node measures 11 mm and has an SUV max equal to 4.14. SKELETON No hypermetabolic bone metastases identified. IMPRESSION: 1. There is intense malignant range FDG uptake associated with the anal mass. 2. Enlarged and hypermetabolic left inguinal lymph node. Cannot rule out metastatic adenopathy. Electronically Signed   By: Kerby Moors M.D.   On: 09/02/2015 12:38   Dg Chest Port 1 View  08/30/2015  CLINICAL DATA:  Post Port-A-Cath placement EXAM: PORTABLE CHEST 1 VIEW COMPARISON:  Portable exam 1101 hours correlated with prior CT chest 08/21/2015 FINDINGS: LEFT subclavian power port with catheter tip  projecting over SVC. No pneumothorax. Normal heart size, mediastinal contours, and pulmonary vascularity. Subsegmental atelectasis at both lung bases. No infiltrate or pleural effusion. IMPRESSION: No pneumothorax following LEFT subclavian power port placement. Electronically Signed   By: Lavonia Dana M.D.   On: 08/30/2015 11:21   Dg C-arm 1-60 Min-no Report  08/30/2015  CLINICAL DATA: port a cath placement C-ARM 1-60 MINUTES Fluoroscopy was utilized by the requesting physician.  No radiographic interpretation.      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 Oneida Alar 08/28/2015 Port a cath placement  We reviewed her PET scan. Again discussed therapy, side effects. She is meeting with Hildred Alamin today.  I have started her on a long acting pain medication (oxycontin) reviewed how to use it in conjunction with her short acting medication.  She will see Dr. Lisbeth Renshaw on Thursday, and start treatment on May 8th.   She can increase her Miralax to twice a day.  I also recommend that she meet Abby Potash today for consult, she is agreeable.  She will return on D 1 of beginning therapy to answer any additional questions or concerns.   All questions were answered. The patient knows to call the clinic with any problems, questions or concerns.  This document serves as a record of services personally performed by Ancil Linsey, MD. It was created on her behalf by Toni Amend, a trained medical scribe. The creation of this record is based on the scribe's personal observations and the provider's statements to them. This document has been checked and approved by the attending provider.  I have reviewed the above documentation for accuracy and completeness and I agree with the above.  This note was electronically signed.    Molli Hazard, MD  09/05/2015 5:46  PM

## 2015-09-03 NOTE — Patient Instructions (Signed)
[place] [facility] Discharge Instructions  RECOMMENDATIONS MADE BY THE CONSULTANT AND ANY TEST RESULTS WILL BE SENT TO YOUR REFERRING PHYSICIAN.   Dr. Lisbeth Renshaw on 09/05/15 @ 8am in [place].  Oxycodone long acting prescribed. Take this twice a day. Take the short acting for breakthrough pain.  PET scan looks good. You are curable. You have one lymph node that is positive for cancer. There is a copy of this in your blue folder.  Your drugs will be called Mitomycin and 5FU. You will get your chemo here @ [facility].  Radiation is in [place] @ [facility].  PORT site is bruised. It will heal.   Miralax 1 capful. You can take 1 capful a day or go up to 2 capfuls a day. This is to soften the stool. This will not make you go. It just makes your stool softer.  Call [facility] with all questions/concerns [phone].  You didn't do anything to deserve or get this cancer. You just developed this cancer.   Abby Potash is our [facility] representative and she will be seeing you today.   Thank you for choosing [facility] [facility] to provide your oncology and hematology care.  To afford each patient quality time with our provider, please arrive at least 15 minutes before your scheduled appointment time.   Beginning January 23rd 2017 lab work for the [facility] will be done in the  Main lab at [facility] on 1st floor. If you have a lab appointment with the [facility] please come in thru the  Main Entrance and check in at the main information desk  You need to re-schedule your appointment should you arrive 10 or more minutes late.  We strive to give you quality time with our providers, and arriving late affects you and other patients whose appointments are after yours.  Also, if you no show three or more times for appointments you may be dismissed from the clinic at the providers discretion.     Again, thank you for choosing  [facility].  Our hope is that these requests will decrease the amount of time that you wait before being seen by our physicians.       _____________________________________________________________  Should you have questions after your visit to [facility], please contact our office at [phone] between the hours of 8:30 a.m. and 4:30 p.m.  Voicemails left after 4:30 p.m. will not be returned until the following business day.  For prescription refill requests, have your pharmacy contact our office.         Resources For Cancer Patients and their Caregivers ? American Cancer Society: Can assist with transportation, wigs, general needs, runs Look Good Feel Better.        ([phone] 596-1819 ? Cancer Care: Provides financial assistance, online support groups, medication/co-pay assistance.  1-800-813-HOPE 510 105 6548) ? [facility] Assists [place] cancer patients and their families through emotional , educational and financial support.  906 526 0094 ? [place] DSS Where to apply for food stamps, Medicaid and utility assistance. 541-603-0459 ? [facility]: Transportation to medical appointments. 502 781 4907 ? Social Security Administration: May apply for disability if have a Stage IV cancer. (949)227-9643 (423) 218-3569 ? [employer], Disability and Transit Services: Assists with nutrition, care and transit needs. (281)001-6259

## 2015-09-05 ENCOUNTER — Encounter (HOSPITAL_COMMUNITY): Payer: Self-pay | Admitting: Hematology & Oncology

## 2015-09-06 ENCOUNTER — Other Ambulatory Visit (HOSPITAL_COMMUNITY): Payer: Self-pay | Admitting: *Deleted

## 2015-09-06 ENCOUNTER — Telehealth: Payer: Self-pay | Admitting: Gastroenterology

## 2015-09-06 DIAGNOSIS — C210 Malignant neoplasm of anus, unspecified: Secondary | ICD-10-CM

## 2015-09-06 MED ORDER — PROCHLORPERAZINE MALEATE 10 MG PO TABS: 10.0000 mg | ORAL_TABLET | Freq: Four times a day (QID) | ORAL | Status: DC | PRN

## 2015-09-06 NOTE — Telephone Encounter (Signed)
Please call pt. She had HYPERPLASTIC POLYPS removed.   DRINK WATER TO KEEP YOUR URINE LIGHT YELLOW.  FOLLOW A HIGH FIBER DIET. AVOID ITEMS THAT CAUSE BLOATING.   Next colonoscopy in 10 years.

## 2015-09-09 NOTE — Telephone Encounter (Signed)
Pt aware of results 

## 2015-09-09 NOTE — Telephone Encounter (Signed)
Reminder in epic °

## 2015-09-13 ENCOUNTER — Encounter (HOSPITAL_COMMUNITY): Payer: Self-pay

## 2015-09-16 ENCOUNTER — Encounter (HOSPITAL_COMMUNITY): Payer: Medicare Other | Attending: Hematology & Oncology

## 2015-09-16 ENCOUNTER — Encounter (HOSPITAL_COMMUNITY): Payer: Self-pay

## 2015-09-16 VITALS — BP 113/67 | HR 79 | Temp 98.4°F | Resp 20 | Wt 148.0 lb

## 2015-09-16 DIAGNOSIS — C210 Malignant neoplasm of anus, unspecified: Secondary | ICD-10-CM | POA: Insufficient documentation

## 2015-09-16 DIAGNOSIS — R300 Dysuria: Secondary | ICD-10-CM | POA: Diagnosis present

## 2015-09-16 DIAGNOSIS — K120 Recurrent oral aphthae: Secondary | ICD-10-CM | POA: Diagnosis present

## 2015-09-16 DIAGNOSIS — Z5111 Encounter for antineoplastic chemotherapy: Secondary | ICD-10-CM | POA: Diagnosis present

## 2015-09-16 LAB — CBC WITH DIFFERENTIAL/PLATELET
Basophils Absolute: 0 10*3/uL (ref 0.0–0.1)
Basophils Relative: 0 %
EOS ABS: 0.2 10*3/uL (ref 0.0–0.7)
Eosinophils Relative: 1 %
HCT: 41.5 % (ref 36.0–46.0)
Hemoglobin: 13.8 g/dL (ref 12.0–15.0)
LYMPHS ABS: 2.9 10*3/uL (ref 0.7–4.0)
LYMPHS PCT: 27 %
MCH: 30.9 pg (ref 26.0–34.0)
MCHC: 33.3 g/dL (ref 30.0–36.0)
MCV: 93 fL (ref 78.0–100.0)
MONO ABS: 0.5 10*3/uL (ref 0.1–1.0)
MONOS PCT: 4 %
Neutro Abs: 7.3 10*3/uL (ref 1.7–7.7)
Neutrophils Relative %: 68 %
PLATELETS: 261 10*3/uL (ref 150–400)
RBC: 4.46 MIL/uL (ref 3.87–5.11)
RDW: 12.6 % (ref 11.5–15.5)
WBC: 10.8 10*3/uL — AB (ref 4.0–10.5)

## 2015-09-16 LAB — COMPREHENSIVE METABOLIC PANEL
ALT: 17 U/L (ref 14–54)
ANION GAP: 12 (ref 5–15)
AST: 26 U/L (ref 15–41)
Albumin: 4.5 g/dL (ref 3.5–5.0)
Alkaline Phosphatase: 70 U/L (ref 38–126)
BUN: 22 mg/dL — ABNORMAL HIGH (ref 6–20)
CALCIUM: 9.4 mg/dL (ref 8.9–10.3)
CHLORIDE: 99 mmol/L — AB (ref 101–111)
CO2: 27 mmol/L (ref 22–32)
CREATININE: 0.89 mg/dL (ref 0.44–1.00)
Glucose, Bld: 127 mg/dL — ABNORMAL HIGH (ref 65–99)
Potassium: 4 mmol/L (ref 3.5–5.1)
SODIUM: 138 mmol/L (ref 135–145)
Total Bilirubin: 0.3 mg/dL (ref 0.3–1.2)
Total Protein: 8.3 g/dL — ABNORMAL HIGH (ref 6.5–8.1)

## 2015-09-16 MED ORDER — SODIUM CHLORIDE 0.9 % IV SOLN
Freq: Once | INTRAVENOUS | Status: AC
  Administered 2015-09-16: 13:00:00 via INTRAVENOUS
  Filled 2015-09-16: qty 4

## 2015-09-16 MED ORDER — SODIUM CHLORIDE 0.9% FLUSH
10.0000 mL | INTRAVENOUS | Status: DC | PRN
  Administered 2015-09-16: 10 mL
  Filled 2015-09-16: qty 10

## 2015-09-16 MED ORDER — HEPARIN SOD (PORK) LOCK FLUSH 100 UNIT/ML IV SOLN: 500.0000 [IU] | Freq: Once | INTRAVENOUS | Status: DC | PRN

## 2015-09-16 MED ORDER — SODIUM CHLORIDE 0.9 % IV SOLN
Freq: Once | INTRAVENOUS | Status: AC
  Administered 2015-09-16: 13:00:00 via INTRAVENOUS

## 2015-09-16 MED ORDER — OXYCODONE HCL 15 MG PO TABS: 15.0000 mg | ORAL_TABLET | ORAL | Status: DC | PRN

## 2015-09-16 MED ORDER — SODIUM CHLORIDE 0.9 % IV SOLN
1000.0000 mg/m2/d | INTRAVENOUS | Status: DC
  Administered 2015-09-16: 7050 mg via INTRAVENOUS
  Filled 2015-09-16: qty 100

## 2015-09-16 MED ORDER — MITOMYCIN CHEMO IV INJECTION 20 MG
10.0000 mg/m2 | Freq: Once | INTRAVENOUS | Status: AC
  Administered 2015-09-16: 17.5 mg via INTRAVENOUS
  Filled 2015-09-16: qty 35

## 2015-09-16 NOTE — Patient Instructions (Signed)
[facility] Discharge Instructions for Patients Receiving Chemotherapy   Beginning January 23rd 2017 lab work for the [facility] will be done in the  Main lab at [facility] on 1st floor. If you have a lab appointment with the [facility] please come in thru the  Main Entrance and check in at the main information desk   Today you received the following chemotherapy agents:  Mitomycin and 38fu infusion  If you develop nausea and vomiting, or diarrhea that is not controlled by your medication, call the clinic.    The clinic phone number is [phone]. Office hours are Monday-Friday 8:30am-5:00pm.  BELOW ARE SYMPTOMS THAT SHOULD BE REPORTED IMMEDIATELY:  *FEVER GREATER THAN 101.0 F  *CHILLS WITH OR WITHOUT FEVER  NAUSEA AND VOMITING THAT IS NOT CONTROLLED WITH YOUR NAUSEA MEDICATION  *UNUSUAL SHORTNESS OF BREATH  *UNUSUAL BRUISING OR BLEEDING  TENDERNESS IN MOUTH AND THROAT WITH OR WITHOUT PRESENCE OF ULCERS  *URINARY PROBLEMS  *BOWEL PROBLEMS  UNUSUAL RASH Items with * indicate a potential emergency and should be followed up as soon as possible. If you have an emergency after office hours please contact your primary care physician or go to the nearest emergency department.  Please call the clinic during office hours if you have any questions or concerns.   You may also contact the Patient Navigator at [phone] should you have any questions or need assistance in obtaining follow up care.      Resources For Cancer Patients and their Caregivers ? American Cancer Society: Can assist with transportation, wigs, general needs, runs Look Good Feel Better.        ([phone] 536-9785 ? Cancer Care: Provides financial assistance, online support groups, medication/co-pay assistance.  1-800-813-HOPE (660)802-0902) ? [facility] Assists [place] cancer patients and their families through emotional , educational and financial support.   318-435-4558 ? [place] DSS Where to apply for food stamps, Medicaid and utility assistance. 401-882-2103 ? [employer]: Transportation to medical appointments. 475-583-6637 ? Social Security Administration: May apply for disability if have a Stage IV cancer. 985-572-0617 978-482-3184 ? [employer], Disability and Transit Services: Assists with nutrition, care and transit needs. 718-485-3805        Fluorouracil, 5-FU injection What is this medicine? FLUOROURACIL, 5-FU (flure oh YOOR a sil) is a chemotherapy drug. It slows the growth of cancer cells. This medicine is used to treat many types of cancer like breast cancer, colon or rectal cancer, pancreatic cancer, and stomach cancer. This medicine may be used for other purposes; ask your health care provider or pharmacist if you have questions. What should I tell my health care provider before I take this medicine? They need to know if you have any of these conditions: -blood disorders -dihydropyrimidine dehydrogenase (DPD) deficiency -infection (especially a virus infection such as chickenpox, cold sores, or herpes) -kidney disease -liver disease -malnourished, poor nutrition -recent or ongoing radiation therapy -an unusual or allergic reaction to fluorouracil, other chemotherapy, other medicines, foods, dyes, or preservatives -pregnant or trying to get pregnant -breast-feeding How should I use this medicine? This drug is given as an infusion or injection into a vein. It is administered in a hospital or clinic by a specially trained health care professional. Talk to your pediatrician regarding the use of this medicine in children. Special care may be needed. Overdosage: If you think you have taken too much of this medicine contact a poison control center or emergency room at once.  NOTE: This medicine is only for you. Do not share this medicine with others. What if I miss a dose? It is important not to miss your dose.  Call your doctor or health care professional if you are unable to keep an appointment. What may interact with this medicine? -allopurinol -cimetidine -dapsone -digoxin -hydroxyurea -leucovorin -levamisole -medicines for seizures like ethotoin, fosphenytoin, phenytoin -medicines to increase blood counts like filgrastim, pegfilgrastim, sargramostim -medicines that treat or prevent blood clots like warfarin, enoxaparin, and dalteparin -methotrexate -metronidazole -pyrimethamine -some other chemotherapy drugs like busulfan, cisplatin, estramustine, vinblastine -trimethoprim -trimetrexate -vaccines Talk to your doctor or health care professional before taking any of these medicines: -acetaminophen -aspirin -ibuprofen -ketoprofen -naproxen This list may not describe all possible interactions. Give your health care provider a list of all the medicines, herbs, non-prescription drugs, or dietary supplements you use. Also tell them if you smoke, drink alcohol, or use illegal drugs. Some items may interact with your medicine. What should I watch for while using this medicine? Visit your doctor for checks on your progress. This drug may make you feel generally unwell. This is not uncommon, as chemotherapy can affect healthy cells as well as cancer cells. Report any side effects. Continue your course of treatment even though you feel ill unless your doctor tells you to stop. In some cases, you may be given additional medicines to help with side effects. Follow all directions for their use. Call your doctor or health care professional for advice if you get a fever, chills or sore throat, or other symptoms of a cold or flu. Do not treat yourself. This drug decreases your body's ability to fight infections. Try to avoid being around people who are sick. This medicine may increase your risk to bruise or bleed. Call your doctor or health care professional if you notice any unusual bleeding. Be careful  brushing and flossing your teeth or using a toothpick because you may get an infection or bleed more easily. If you have any dental work done, tell your dentist you are receiving this medicine. Avoid taking products that contain aspirin, acetaminophen, ibuprofen, naproxen, or ketoprofen unless instructed by your doctor. These medicines may hide a fever. Do not become pregnant while taking this medicine. Women should inform their doctor if they wish to become pregnant or think they might be pregnant. There is a potential for serious side effects to an unborn child. Talk to your health care professional or pharmacist for more information. Do not breast-feed an infant while taking this medicine. Men should inform their doctor if they wish to father a child. This medicine may lower sperm counts. Do not treat diarrhea with over the counter products. Contact your doctor if you have diarrhea that lasts more than 2 days or if it is severe and watery. This medicine can make you more sensitive to the sun. Keep out of the sun. If you cannot avoid being in the sun, wear protective clothing and use sunscreen. Do not use sun lamps or tanning beds/booths. What side effects may I notice from receiving this medicine? Side effects that you should report to your doctor or health care professional as soon as possible: -allergic reactions like skin rash, itching or hives, swelling of the face, lips, or tongue -low blood counts - this medicine may decrease the number of white blood cells, red blood cells and platelets. You may be at increased risk for infections and bleeding. -signs of infection - fever or chills, cough, sore throat, pain or  difficulty passing urine -signs of decreased platelets or bleeding - bruising, pinpoint red spots on the skin, black, tarry stools, blood in the urine -signs of decreased red blood cells - unusually weak or tired, fainting spells, lightheadedness -breathing problems -changes in  vision -chest pain -mouth sores -nausea and vomiting -pain, swelling, redness at site where injected -pain, tingling, numbness in the hands or feet -redness, swelling, or sores on hands or feet -stomach pain -unusual bleeding Side effects that usually do not require medical attention (report to your doctor or health care professional if they continue or are bothersome): -changes in finger or toe nails -diarrhea -dry or itchy skin -hair loss -headache -loss of appetite -sensitivity of eyes to the light -stomach upset -unusually teary eyes This list may not describe all possible side effects. Call your doctor for medical advice about side effects. You may report side effects to FDA at 1-800-FDA-1088. Where should I keep my medicine? This drug is given in a hospital or clinic and will not be stored at home. NOTE: This sheet is a summary. It may not cover all possible information. If you have questions about this medicine, talk to your doctor, pharmacist, or health care provider.    2016, Elsevier/Gold Standard. (2007-08-31 13:53:16) Mitomycin injection What is this medicine? MITOMYCIN (mye toe MYE sin) is a chemotherapy drug. This medicine is used to treat cancer of the stomach and pancreas. This medicine may be used for other purposes; ask your health care provider or pharmacist if you have questions. What should I tell my health care provider before I take this medicine? They need to know if you have any of these conditions: -anemia -bleeding disorder -infection (especially a virus infection such as chickenpox, cold sores, or herpes) -kidney disease -low blood counts like low platelets, red blood cells, white blood cells -recent radiation therapy -an unusual or allergic reaction to mitomycin, other chemotherapy agents, other medicines, foods, dyes, or preservatives -pregnant or trying to get pregnant -breast-feeding How should I use this medicine? This drug is given as an  injection or infusion into a vein. It is administered in a hospital or clinic by a specially trained health care professional. Talk to your pediatrician regarding the use of this medicine in children. Special care may be needed. Overdosage: If you think you have taken too much of this medicine contact a poison control center or emergency room at once. NOTE: This medicine is only for you. Do not share this medicine with others. What if I miss a dose? It is important not to miss your dose. Call your doctor or health care professional if you are unable to keep an appointment. What may interact with this medicine? -medicines to increase blood counts like filgrastim, pegfilgrastim, sargramostim -vaccines This list may not describe all possible interactions. Give your health care provider a list of all the medicines, herbs, non-prescription drugs, or dietary supplements you use. Also tell them if you smoke, drink alcohol, or use illegal drugs. Some items may interact with your medicine. What should I watch for while using this medicine? Your condition will be monitored carefully while you are receiving this medicine. You will need important blood work done while you are taking this medicine. This drug may make you feel generally unwell. This is not uncommon, as chemotherapy can affect healthy cells as well as cancer cells. Report any side effects. Continue your course of treatment even though you feel ill unless your doctor tells you to stop. Call your doctor or  health care professional for advice if you get a fever, chills or sore throat, or other symptoms of a cold or flu. Do not treat yourself. This drug decreases your body's ability to fight infections. Try to avoid being around people who are sick. This medicine may increase your risk to bruise or bleed. Call your doctor or health care professional if you notice any unusual bleeding. Be careful brushing and flossing your teeth or using a toothpick  because you may get an infection or bleed more easily. If you have any dental work done, tell your dentist you are receiving this medicine. Avoid taking products that contain aspirin, acetaminophen, ibuprofen, naproxen, or ketoprofen unless instructed by your doctor. These medicines may hide a fever. Do not become pregnant while taking this medicine. Women should inform their doctor if they wish to become pregnant or think they might be pregnant. There is a potential for serious side effects to an unborn child. Talk to your health care professional or pharmacist for more information. Do not breast-feed an infant while taking this medicine. What side effects may I notice from receiving this medicine? Side effects that you should report to your doctor or health care professional as soon as possible: -allergic reactions like skin rash, itching or hives, swelling of the face, lips, or tongue -low blood counts - this medicine may decrease the number of white blood cells, red blood cells and platelets. You may be at increased risk for infections and bleeding. -signs of infection - fever or chills, cough, sore throat, pain or difficulty passing urine -signs of decreased platelets or bleeding - bruising, pinpoint red spots on the skin, black, tarry stools, blood in the urine -signs of decreased red blood cells - unusually weak or tired, fainting spells, lightheadedness -breathing problems -changes in vision -chest pain -confusion -dry cough -high blood pressure -mouth sores -pain, swelling, redness at site where injected -pain, tingling, numbness in the hands or feet -seizures -swelling of the ankles, feet, hands -trouble passing urine or change in the amount of urine Side effects that usually do not require medical attention (report to your doctor or health care professional if they continue or are bothersome): -diarrhea -green to blue color of urine -hair loss -loss of appetite -nausea,  vomiting This list may not describe all possible side effects. Call your doctor for medical advice about side effects. You may report side effects to FDA at 1-800-FDA-1088. Where should I keep my medicine? This drug is given in a hospital or clinic and will not be stored at home. NOTE: This sheet is a summary. It may not cover all possible information. If you have questions about this medicine, talk to your doctor, pharmacist, or health care provider.    2016, Elsevier/Gold Standard. (2007-11-03 11:16:23)

## 2015-09-16 NOTE — Progress Notes (Signed)
1400:  A&Ox4. In no distress. Tolerated mitomycin w/o adverse reaction.  Discharged ambulatory in c/o spouse for transport home.

## 2015-09-17 ENCOUNTER — Telehealth (HOSPITAL_COMMUNITY): Payer: Self-pay | Admitting: *Deleted

## 2015-09-17 NOTE — Telephone Encounter (Signed)
24 hour follow up: Patient doing ok other than some blood coming from rectum. This started yesterday. Pt states that she will keep a watch on it. Tom and Dr. Whitney Muse notified. Dr. 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 Pt to call me if she thinks bleeding is worsening and if severe bleeding to go to ER. She verbalized understanding of instructions.

## 2015-09-20 ENCOUNTER — Encounter (HOSPITAL_BASED_OUTPATIENT_CLINIC_OR_DEPARTMENT_OTHER): Payer: Medicare Other

## 2015-09-20 VITALS — BP 125/68 | HR 90 | Temp 98.8°F | Resp 16

## 2015-09-20 DIAGNOSIS — C210 Malignant neoplasm of anus, unspecified: Secondary | ICD-10-CM | POA: Diagnosis present

## 2015-09-20 MED ORDER — HEPARIN SOD (PORK) LOCK FLUSH 100 UNIT/ML IV SOLN
INTRAVENOUS | Status: AC
  Filled 2015-09-20: qty 5

## 2015-09-20 MED ORDER — HEPARIN SOD (PORK) LOCK FLUSH 100 UNIT/ML IV SOLN
500.0000 [IU] | Freq: Once | INTRAVENOUS | Status: AC | PRN
  Administered 2015-09-20: 500 [IU]

## 2015-09-20 MED ORDER — SODIUM CHLORIDE 0.9% FLUSH
10.0000 mL | INTRAVENOUS | Status: DC | PRN
  Administered 2015-09-20: 10 mL
  Filled 2015-09-20: qty 10

## 2015-09-20 NOTE — Progress Notes (Signed)
Patient reports for home infusion pump removal and port flush.  Pump removed, patient tolerated well without any issues. Port flushed with 28ml NS and 500units Heparin per protocol, and de-accessed.

## 2015-09-20 NOTE — Patient Instructions (Signed)
[facility] at [facility] Discharge Instructions  RECOMMENDATIONS MADE BY THE CONSULTANT AND ANY TEST RESULTS WILL BE SENT TO YOUR REFERRING PHYSICIAN.  Home infusion pump removal and port flush.    Thank you for choosing [facility] at [facility] to provide your oncology and hematology care.  To afford each patient quality time with our provider, please arrive at least 15 minutes before your scheduled appointment time.   Beginning January 23rd 2017 lab work for the [facility] will be done in the  Main lab at [facility] on 1st floor. If you have a lab appointment with the [facility] please come in thru the  Main Entrance and check in at the main information desk  You need to re-schedule your appointment should you arrive 10 or more minutes late.  We strive to give you quality time with our providers, and arriving late affects you and other patients whose appointments are after yours.  Also, if you no show three or more times for appointments you may be dismissed from the clinic at the providers discretion.     Again, thank you for choosing [facility].  Our hope is that these requests will decrease the amount of time that you wait before being seen by our physicians.       _____________________________________________________________  Should you have questions after your visit to [facility], please contact our office at [phone] between the hours of 8:30 a.m. and 4:30 p.m.  Voicemails left after 4:30 p.m. will not be returned until the following business day.  For prescription refill requests, have your pharmacy contact our office.         Resources For Cancer Patients and their Caregivers ? American Cancer Society: Can assist with transportation, wigs, general needs, runs Look Good Feel Better.        639 642 4237 ? Cancer Care: Provides financial assistance, online support groups, medication/co-pay  assistance.  1-800-813-HOPE 380-787-0328) ? [place] Assists [place] cancer patients and their families through emotional , educational and financial support.  5137924982 ? [place] DSS Where to apply for food stamps, Medicaid and utility assistance. 779-261-6801 ? [employer]: Transportation to medical appointments. 2120521485 ? Social Security Administration: May apply for disability if have a Stage IV cancer. 559-238-0297 [phone] ? [employer], Disability and Transit Services: Assists with nutrition, care and transit needs. [place] Support Programs: @10RELATIVEDAYS @ > Cancer Support Group  2nd Tuesday of the month 1pm-2pm, Journey Room  > Creative Journey  3rd Tuesday of the month 1130am-1pm, Journey Room  > Look Good Feel Better  1st Wednesday of the month 10am-12 noon, Journey Room (Call [place] to register [phone])

## 2015-09-23 ENCOUNTER — Encounter (HOSPITAL_BASED_OUTPATIENT_CLINIC_OR_DEPARTMENT_OTHER): Payer: Medicare Other | Admitting: Hematology & Oncology

## 2015-09-23 ENCOUNTER — Encounter (HOSPITAL_COMMUNITY): Payer: Self-pay | Admitting: Hematology & Oncology

## 2015-09-23 ENCOUNTER — Encounter (HOSPITAL_BASED_OUTPATIENT_CLINIC_OR_DEPARTMENT_OTHER): Payer: Medicare Other

## 2015-09-23 VITALS — BP 95/54 | HR 95 | Temp 98.1°F | Resp 18 | Wt 142.0 lb

## 2015-09-23 VITALS — BP 90/59 | HR 70 | Temp 98.0°F | Resp 18

## 2015-09-23 DIAGNOSIS — K1230 Oral mucositis (ulcerative), unspecified: Secondary | ICD-10-CM | POA: Diagnosis not present

## 2015-09-23 DIAGNOSIS — I952 Hypotension due to drugs: Secondary | ICD-10-CM

## 2015-09-23 DIAGNOSIS — C210 Malignant neoplasm of anus, unspecified: Secondary | ICD-10-CM | POA: Diagnosis present

## 2015-09-23 DIAGNOSIS — K1231 Oral mucositis (ulcerative) due to antineoplastic therapy: Secondary | ICD-10-CM

## 2015-09-23 DIAGNOSIS — F418 Other specified anxiety disorders: Secondary | ICD-10-CM | POA: Diagnosis not present

## 2015-09-23 DIAGNOSIS — I959 Hypotension, unspecified: Secondary | ICD-10-CM

## 2015-09-23 MED ORDER — MAGIC MOUTHWASH: ORAL | Status: DC

## 2015-09-23 MED ORDER — SODIUM CHLORIDE 0.9 % IV SOLN
INTRAVENOUS | Status: DC
  Administered 2015-09-23: 10:00:00 via INTRAVENOUS

## 2015-09-23 MED ORDER — HEPARIN SOD (PORK) LOCK FLUSH 100 UNIT/ML IV SOLN
500.0000 [IU] | Freq: Once | INTRAVENOUS | Status: AC
  Administered 2015-09-23: 500 [IU] via INTRAVENOUS

## 2015-09-23 MED ORDER — HEPARIN SOD (PORK) LOCK FLUSH 100 UNIT/ML IV SOLN
INTRAVENOUS | Status: AC
Start: 2015-09-23 — End: 2015-09-23
  Filled 2015-09-23: qty 5

## 2015-09-23 NOTE — Progress Notes (Signed)
Patient tolerated infusion well.  VSS.   

## 2015-09-23 NOTE — Progress Notes (Signed)
Patient reported that she did fine after her chemotherapy for 24 hour follow up.  No issues, and she understands to call the cancer center with any questions or concerns.

## 2015-09-23 NOTE — Progress Notes (Signed)
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   08/15/2015 Pathology Results Invasive squamous cell carcinoma with involvement of multiple margins   08/21/2015 Imaging CT CAP- mild diffuse distal rectal wall thickening possibly representing recently diagnosed malignancy, borderline L pelvic and bilateral inguinal adenopathy   08/27/2015 Procedure Colonoscopy with Dr. Oneida Alar   09/02/2015 PET scan intense malignant range FDG uptake associated with the anal mass, enlarged and hypermetabolic L inguinal LN, cannot rule out metastatic adenopathy. nodule in R lobe of thyroid 2.5 cm no significant FDG uptake   09/16/2015 -  Radiation Therapy Dr. Lisbeth Renshaw, planned to begin on 09/16/2015   09/16/2015 -  Chemotherapy Mitomycin/5-FU    HISTORY OF PRESENTING ILLNESS:  Jillian Estrada is here for follow-up of squamous cell carcinoma of the anus.   Jillian Estrada returns to [facility] today accompanied by her husband.  She admits the chemotherapy "really hit me Friday night". She has not been eating well since developing a sore mouth and throat. She still has a good appetite and was eating well before this.  She was constipated for the past week so she took stool softener and Senokot last night, leading to one episode of diarrhea this morning. She denies any other episodes of diarrhea. No cramping, no heartburn.  Radiation treatment is going well with some associated "rawness." She sees Dr. Lisbeth Renshaw.  She has been sitting better, although she feels a little raw. She is sleeping better.   In the evenings, "I  fall apart". Her husband believes she is depressed as she seems to cry for no reason. She has a Social worker and psychiatrist. She has been seeing her psychiatrist but finds it difficult to make an appointment with her counselor with her recent schedule. When told to stay positive, she says "it is hard to do that".   Reports weight loss, secondary to her sore mouth. She denies nausea, vomiting.   MEDICAL HISTORY:  Past Medical History  Diagnosis Date  . Depression     ?Bipolar, sees psychiatrist, Sees Dr. Riley Churches in [place]. Hospitalized a few times, Feb 2013 and Aug 2015.   Marland Kitchen Hypertension   . Anal cancer ([place])   . Kidney failure     Lithium overdose unintentional, now resolved  . Hepatitis C     treated through [facility]  . 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 Anal cancer ([place])   . Cancer [facility])     Anal    SURGICAL HISTORY: Past Surgical History  Procedure Laterality Date  . Back surgery      Nov 2008, Aug 2011  . Small bowel repair      June 2010. Injury to small bowel during ovarian cyst surgery  . Foreign object removal      Aug 2010, surgical tool left during ovarian surgery  . Colonoscopy  2010    [place]: non-bleeding internal hemorrhoids   . Rectal biopsy    . Colonoscopy with propofol N/A 08/27/2015    Procedure: COLONOSCOPY WITH PROPOFOL;  Surgeon: Danie Binder, MD;  Location: [facility] ENDO SUITE;  Service: Endoscopy;  Laterality:  N/A;  915  . Portacath placement Left 08/30/2015    Procedure: INSERTION PORT-A-CATH LEFT SUBCLAVIAN;  Surgeon: Aviva Signs, MD;  Location: [facility] ORS;  Service: General;  Laterality: Left;    SOCIAL HISTORY: Social History   Social History  . Marital Status: Married    Spouse Name: N/A  . Number of Children: N/A  . Years of Education: N/A   Occupational History  . retired     worked 28 years for [employer] Topics  . Smoking status: Never Smoker   . Smokeless tobacco: Not on  file  . Alcohol Use: 0.0 oz/week    0 Standard drinks or equivalent per week     Comment: occasionally  . Drug Use: No  . Sexual Activity: Not on file   Other Topics Concern  . Not on file   Social History Narrative   Married; husband Iona Beard. 42 years married. He has two children; she has step children. 1 step grand-child. Born in [place], [place]. Lived in [place]. Now live in [place], [place] She says she has 5 dogs; pomeranians and a chihuahua.  Other than this, she says she has "no hobbies." Her husband builds show cars, which she assists with. She notes that she doesn't garden. Non-smoker, quit in 2001. Her husband quit two years ago. She drinks occasionally, noting "a beer."  FAMILY HISTORY: Family History  Problem Relation Age of Onset  . Colon polyps Father   . Colon cancer Neg Hx    Father is deceased, November 03, 2012. He had heart attacks and a brain bleed from a stroke. Mother 56, lives in [place]; "never wants me to come see her." Says her mother says she can't muster up the strength to have visitors. Had a sister, who was killed in an automobile accident. 1 adopted brother as well.  ALLERGIES:  is allergic to penicillins and codeine.  MEDICATIONS:  Current Outpatient Prescriptions  Medication Sig Dispense Refill  . alendronate (FOSAMAX) 70 MG tablet Take 70 mg by mouth once a week. Take with a full glass of water on an empty stomach.(Tuesday)    . allopurinol (ZYLOPRIM) 100 MG tablet Take 100 mg by mouth daily.    . ARIPiprazole (ABILIFY) 15 MG tablet Take 15 mg by mouth daily.    Marland Kitchen aspirin EC 81 MG tablet Take 1 tablet (81 mg total) by mouth daily. For prevention of stroke and heart attack 1 tablet 0  . Biotin 5000 MCG CAPS Take 1 capsule by mouth 3 (three) times daily.     . cetirizine (ZYRTEC) 10 MG tablet Take 10 mg by mouth daily.    . Cholecalciferol (VITAMIN D) 2000 UNITS CAPS Take 1 capsule (2,000 Units total) by mouth every morning. For Vitamin D  replacement and mood control. 30 capsule 0  . clonazePAM (KLONOPIN) 0.5 MG tablet Take 0.5 mg by mouth at bedtime.     . cyclobenzaprine (FLEXERIL) 10 MG tablet Take 1 tablet (10 mg total) by mouth 3 (three) times daily as needed. For pain 30 tablet 0  . docusate sodium (COLACE) 100 MG capsule Take 2 capsules (200 mg total) by mouth every morning. For constipation 10 capsule 0  . fluorouracil CALGB 60454 in sodium chloride 0.9 % 150 mL Inject into the vein over 96 hr. To infuse Days 1-5 and Days 28-32    . lidocaine-prilocaine (EMLA) cream Apply a quarter size amount to port site 1 hour prior to chemo. Do not  rub in. Cover with plastic wrap. 30 g 3  . magic mouthwash SOLN Swish and swallow 6ml four times a day for mouth pain. 360 mL 1  . metoprolol tartrate (LOPRESSOR) 25 MG tablet Take 25 mg by mouth 2 (two) times daily.    Marland Kitchen MITOMYCIN IV Inject into the vein. Day 1 and Day 28    . ondansetron (ZOFRAN) 8 MG tablet Take 1 tablet (8 mg total) by mouth every 8 (eight) hours as needed for nausea or vomiting. 30 tablet 2  . oxyCODONE (OXYCONTIN) 30 MG 12 hr tablet Take 30 mg by mouth 2 (two) times daily. 60 each 0  . oxyCODONE (ROXICODONE) 15 MG immediate release tablet Take 1 tablet (15 mg total) by mouth every 4 (four) hours as needed for pain. 90 tablet 0  . oxyCODONE-acetaminophen (PERCOCET/ROXICET) 5-325 MG tablet 1 or 2 tabs PO q6h prn pain 20 tablet 0  . prochlorperazine (COMPAZINE) 10 MG tablet Take 1 tablet (10 mg total) by mouth every 6 (six) hours as needed for nausea or vomiting. 30 tablet 0  . sertraline (ZOLOFT) 100 MG tablet Take 150 mg by mouth daily.    Marland Kitchen tolterodine (DETROL) 2 MG tablet Take 2 mg by mouth daily as needed (bladder). Reported on 08/27/2015    . venlafaxine XR (EFFEXOR-XR) 150 MG 24 hr capsule Take 150 mg by mouth daily with breakfast.     Current Facility-Administered Medications  Medication Dose Route Frequency Provider Last Rate Last Dose  . 0.9 %  sodium chloride  infusion   Intravenous Continuous Patrici Ranks, MD   Stopped at 09/23/15 1125    Review of Systems  Constitutional: Positive for weight loss.     Weight loss secondary to sore mouth  HENT: Positive for mouth sores.      Chemotherapy induced.  Eyes: Negative.   Respiratory: Negative.   Cardiovascular: Negative.   Gastrointestinal: Positive for constipation and diarrhea.       Pain in the anal region associated with lesion. Rawness associated with XRT.       One episode of diarrhea this morning after taking senokot and stool softener yesterday. Prior to that, constipation for about 1 week.  Genitourinary: Negative.   Musculoskeletal: Positive for back pain.       Chronic bad back.  Skin: Negative.   Neurological: Positive for depression.      Depression associated with cancer diagnosis.  Endo/Heme/Allergies: Negative.   Psychiatric/Behavioral: Negative.   All other systems reviewed and are negative.  14 point ROS was done and is otherwise as detailed above or in HPI   PHYSICAL EXAMINATION: ECOG PERFORMANCE STATUS: 1 - Symptomatic but completely ambulatory  Filed Vitals:   09/23/15 0920  BP: 95/54  Pulse: 95  Temp: 98.1 F (36.7 C)  Resp: 18   Filed Weights   09/23/15 0920  Weight: 142 lb (64.411 kg)    Physical Exam  Constitutional: She is oriented to person, place, and time and well-developed, well-nourished, and in no distress.  HENT:  Head: Normocephalic and atraumatic.  Nose: Nose normal.  Mouth/Throat: Oropharynx is clear and moist. No oropharyngeal exudate. Mucosa with erythema, no oral ulcerations Eyes: Conjunctivae and EOM are normal. Pupils are equal, round, and reactive to light. Right eye exhibits no discharge. Left eye exhibits no discharge. No scleral icterus.  Neck: Normal range of motion. Neck supple. No tracheal deviation present. No thyromegaly present.  Cardiovascular: Normal rate, regular rhythm and normal heart sounds.  Exam reveals  no gallop  and no friction rub.   No murmur heard. Port a cath C/D/I Pulmonary/Chest: Effort normal and breath sounds normal. She has no wheezes. She has no rales.  Abdominal: Soft. Bowel sounds are normal. She exhibits no distension and no mass. There is no tenderness. There is no rebound and no guarding.  Genitourinary:  Rectal exam deferred secondary to patient's pain  Musculoskeletal: Normal range of motion. She exhibits no edema.  Lymphadenopathy:    She has no cervical adenopathy.       Left: Inguinal adenopathy present.  L side palpable lymph node.  Neurological: She is alert and oriented to person, place, and time. She has normal reflexes. No cranial nerve deficit. Gait normal. Coordination normal.  Skin: Skin is warm and dry. No rash noted.  Psychiatric: Mood, memory, affect and judgment normal.  Nursing note and vitals reviewed.   LABORATORY DATA:  I have reviewed the data as listed Lab Results  Component Value Date   WBC 10.8* 09/16/2015   HGB 13.8 09/16/2015   HCT 41.5 09/16/2015   MCV 93.0 09/16/2015   PLT 261 09/16/2015   CMP     Component Value Date/Time   NA 138 09/16/2015 1500   K 4.0 09/16/2015 1500   CL 99* 09/16/2015 1500   CO2 27 09/16/2015 1500   GLUCOSE 127* 09/16/2015 1500   BUN 22* 09/16/2015 1500   CREATININE 0.89 09/16/2015 1500   CALCIUM 9.4 09/16/2015 1500   PROT 8.3* 09/16/2015 1500   ALBUMIN 4.5 09/16/2015 1500   AST 26 09/16/2015 1500   ALT 17 09/16/2015 1500   ALKPHOS 70 09/16/2015 1500   BILITOT 0.3 09/16/2015 1500   GFRNONAA >60 09/16/2015 1500   GFRAA >60 09/16/2015 1500     RADIOGRAPHIC STUDIES: I have personally reviewed the radiological images as listed and agreed with the findings in the report.  Ct Pelvis W Contrast  08/25/2015  CLINICAL DATA:  Rectal pain. Recent rectal biopsy, diagnosed with cancer. EXAM: CT PELVIS WITHOUT CONTRAST TECHNIQUE: Multidetector CT imaging of the pelvis was performed following the standard protocol  without intravenous contrast. COMPARISON:  None. FINDINGS: Urinary Tract:  Normal appearing urinary bladder and distal ureters. Bowel: Mild distal rectal wall thickening. No fluid or gas collections. The remainder the included colon and small bowel have normal appearances. Normal appearing appendix. Vascular/Lymphatic: Prominent bilateral inguinal lymph nodes with little change. The largest node on the left has a short axis diameter of 11 mm on image number 39 and the largest node on the right has a short axis diameter of 9 mm on image number 39. 6 mm short axis left internal iliac lymph node on image number 27 of series 2. There is also a 9 mm short axis left obturator node on image number 28. The corresponding obturator node on the right has a 7 mm short axis diameter on image number 28. A 7 mm short axis left external iliac node on image number 26. Mild atheromatous arterial calcifications. Reproductive: 1.2 cm calcified uterine fibroid. Bilateral tubal ligation clips. Normal appearing ovaries. Other:  Small umbilical hernia containing fat. Musculoskeletal: Lower lumbar spine degenerative changes with disc bulges. IMPRESSION: 1. Mild diffuse distal rectal wall thickening, possibly representing the recently diagnosed malignancy. 2. Borderline left pelvic and bilateral inguinal adenopathy. This could potentially represent metastatic adenopathy. 3. No acute abnormality. Electronically Signed   By: Claudie Revering M.D.   On: 08/25/2015 15:16   Nm Pet Image Initial (pi) Skull Base To Thigh  09/02/2015  CLINICAL DATA:  Initial treatment strategy for primary anal squamous cell carcinoma. EXAM: NUCLEAR MEDICINE PET SKULL BASE TO THIGH TECHNIQUE: 12.78 mCi F-18 FDG was injected intravenously. Full-ring PET imaging was performed from the skull base to thigh after the radiotracer. CT data was obtained and used for attenuation correction and anatomic localization. FASTING BLOOD GLUCOSE:  Value: 95 mg/dl COMPARISON:  None.  FINDINGS: NECK No hypermetabolic lymph nodes in the neck. Nodule in the right lobe of thyroid gland measures 2.5 cm, image 63 of series 3. No significant FDG uptake associated with this nodule. CHEST No hypermetabolic mediastinal or hilar nodes. No suspicious pulmonary nodules on the CT scan. ABDOMEN/PELVIS No abnormal hypermetabolic activity within the liver, pancreas, adrenal glands, or spleen. Hypermetabolic mass involving the anus measures approximately 3.5 by 3.3 by 3.4 cm and has an SUV max equal to 12.16. Left pelvic side wall lymph node Measures 9 mm and has an SUV max equal to 3 point IIIa. Left inguinal node measures 11 mm and has an SUV max equal to 4.14. SKELETON No hypermetabolic bone metastases identified. IMPRESSION: 1. There is intense malignant range FDG uptake associated with the anal mass. 2. Enlarged and hypermetabolic left inguinal lymph node. Cannot rule out metastatic adenopathy. Electronically Signed   By: Kerby Moors M.D.   On: 09/02/2015 12:38   Dg Chest Port 1 View  08/30/2015  CLINICAL DATA:  Post Port-A-Cath placement EXAM: PORTABLE CHEST 1 VIEW COMPARISON:  Portable exam 1101 hours correlated with prior CT chest 08/21/2015 FINDINGS: LEFT subclavian power port with catheter tip projecting over SVC. No pneumothorax. Normal heart size, mediastinal contours, and pulmonary vascularity. Subsegmental atelectasis at both lung bases. No infiltrate or pleural effusion. IMPRESSION: No pneumothorax following LEFT subclavian power port placement. Electronically Signed   By: Lavonia Dana M.D.   On: 08/30/2015 11:21   Dg C-arm 1-60 Min-no Report  08/30/2015  CLINICAL DATA: port a cath placement C-ARM 1-60 MINUTES Fluoroscopy was utilized by the requesting physician.  No radiographic interpretation.      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 Oneida Alar  08/28/2015 Port a cath placement Hypotension Mild mucositis secondary 5-FU   Advised for the patient to hold her evening Lisinopril. The patient was given IV fluids today.   I have written the patient a prescription for Magic Mouthwash. She was advised to come into the clinic if her mouth soreness worsens or if she develops any ulcers.  The patient notes some depression associated with her diagnosis. She currently follows with a psychiatrist. I have encouraged her to continue to talk with [place] about her diagnosis and to also touch base with Abby Potash if needed. I encouraged her to consider our support group.   I will refer the patient to Ovid Curd for a dietary consult.   Radiation treatment with Dr. Lisbeth Renshaw is going well.  She will return for follow up in 1 week. All questions were answered. The patient knows to call the clinic with any problems, questions or concerns.  This document serves as a record of services personally performed by Ancil Linsey, MD. It was created on her behalf by Arlyce Harman, a trained medical scribe. The creation of this record is based on the scribe's personal observations and the provider's statements to them. This document has been checked and approved by the attending provider.  I have reviewed the  above documentation for accuracy and completeness and I agree with the above.  This note was electronically signed.    Molli Hazard, MD  09/23/2015 11:52 AM

## 2015-09-23 NOTE — Patient Instructions (Addendum)
[place] at [facility] Discharge Instructions  RECOMMENDATIONS MADE BY THE CONSULTANT AND ANY TEST RESULTS WILL BE SENT TO YOUR REFERRING PHYSICIAN.  Exam and discussion today with Dr. Whitney Muse.  Patient needs 500cc of NS over 1 hour today.  Refer to [place] - for nutrition. He should be contacting you.  We are prescribing Magic Mouthwash for you. Use this 4 times a day - swish and swallow 52ml. Make sure you swish for 30 seconds to 1 minute & then swallow. Magic mouthwash called in to your pharmacy of choice.  Hold Lisinopril due to low blood pressure.   Return to see Dr. Whitney Muse in 1 week with labs. Labs to be done @ 12:40 on the 1st floor on Monday 09/30/15. Sign in at the [facility], they will get you for labs down there. Then come up to see Dr. Whitney Muse @ 1pm.   Your radiation appointment on 5/22 is probably going to change.     Thank you for choosing [place] at [facility] to provide your oncology and hematology care.  To afford each patient quality time with our provider, please arrive at least 15 minutes before your scheduled appointment time.   Beginning January 23rd 2017 lab work for the [facility] will be done in the  Main lab at [facility] on 1st floor. If you have a lab appointment with the [facility] please come in thru the  Main Entrance and check in at the main information desk  You need to re-schedule your appointment should you arrive 10 or more minutes late.  We strive to give you quality time with our providers, and arriving late affects you and other patients whose appointments are after yours.  Also, if you no show three or more times for appointments you may be dismissed from the clinic at the providers discretion.     Again, thank you for choosing [facility].  Our hope is that these requests will decrease the amount of time that you wait before being seen by our physicians.        _____________________________________________________________  Should you have questions after your visit to [facility], please contact our office at [phone] between the hours of 8:30 a.m. and 4:30 p.m.  Voicemails left after 4:30 p.m. will not be returned until the following business day.  For prescription refill requests, have your pharmacy contact our office.         Resources For Cancer Patients and their Caregivers ? American Cancer Society: Can assist with transportation, wigs, general needs, runs Look Good Feel Better.        (615) 494-3631 ? Cancer Care: Provides financial assistance, online support groups, medication/co-pay assistance.  1-800-813-HOPE 7121537672) ? [facility] Assists [place] cancer patients and their families through emotional , educational and financial support.  541-169-6530 ? [place] DSS Where to apply for food stamps, Medicaid and utility assistance. 520-885-4424 ? RCATS: Transportation to medical appointments. 281-089-8766 ? Social Security Administration: May apply for disability if have a Stage IV cancer. 567-601-9153 254-540-3708 ? [employer], Disability and Transit Services: Assists with nutrition, care and transit needs. [place] Support Programs: @10RELATIVEDAYS @ > Cancer Support Group  2nd Tuesday of the month 1pm-2pm, Journey Room  > Creative Journey  3rd Tuesday of the month 1130am-1pm, Journey Room  > Look Good Feel Better  1st Wednesday of the month 10am-12  noon, Journey Room (Call [facility] to register [phone])

## 2015-09-23 NOTE — Patient Instructions (Signed)
[facility] at [facility] Discharge Instructions  RECOMMENDATIONS MADE BY THE CONSULTANT AND ANY TEST RESULTS WILL BE SENT TO YOUR REFERRING PHYSICIAN.  IV fluids today.    Thank you for choosing  Cancer Center at [facility] to provide your oncology and hematology care.  To afford each patient quality time with our provider, please arrive at least 15 minutes before your scheduled appointment time.   Beginning January 23rd 2017 lab work for the Cancer Center will be done in the  Main lab at [facility] on 1st floor. If you have a lab appointment with the Cancer Center please come in thru the  Main Entrance and check in at the main information desk  You need to re-schedule your appointment should you arrive 10 or more minutes late.  We strive to give you quality time with our providers, and arriving late affects you and other patients whose appointments are after yours.  Also, if you no show three or more times for appointments you may be dismissed from the clinic at the providers discretion.     Again, thank you for choosing  Cancer Center.  Our hope is that these requests will decrease the amount of time that you wait before being seen by our physicians.       _____________________________________________________________  Should you have questions after your visit to  Cancer Center, please contact our office at [phone] between the hours of 8:30 a.m. and 4:30 p.m.  Voicemails left after 4:30 p.m. will not be returned until the following business day.  For prescription refill requests, have your pharmacy contact our office.         Resources For Cancer Patients and their Caregivers ? American Cancer Society: Can assist with transportation, wigs, general needs, runs Look Good Feel Better.        1-888-227-6333 ? Cancer Care: Provides financial assistance, online support groups, medication/co-pay assistance.   1-800-813-HOPE (4673) ? [facility] Assists [place] cancer patients and their families through emotional , educational and financial support.  336-427-4357 ? [place] DSS Where to apply for food stamps, Medicaid and utility assistance. 336-342-1394 ? RCATS: Transportation to medical appointments. 336-347-2287 ? Social Security Administration: May apply for disability if have a Stage IV cancer. 336-342-7796 1-800-772-1213 ? [place] Aging, Disability and Transit Services: Assists with nutrition, care and transit needs. 336-349-2343  Cancer Center Support Programs: @10RELATIVEDAYS@ > Cancer Support Group  2nd Tuesday of the month 1pm-2pm, Journey Room  > Creative Journey  3rd Tuesday of the month 1130am-1pm, Journey Room  > Look Good Feel Better  1st Wednesday of the month 10am-12 noon, Journey Room (Call American Cancer Society to register 1-800-395-5775)    

## 2015-09-23 NOTE — Progress Notes (Signed)
Pt at clinic for 24 hr f/u post chemotherapy (taken off 41fu pump on 09/20/15).  C/o decreased appetite, pain with swallowing, mouth sores, and increased fatigue. Pt has 6 lb weight loss in one week.  MD made aware. Pt will receive IVF in clinic today, Rx for Magic Mouthwash called in to pt's pharmacy, and consult with Burtis Junes, dietician, requested.

## 2015-09-24 ENCOUNTER — Telehealth (HOSPITAL_COMMUNITY): Payer: Self-pay | Admitting: *Deleted

## 2015-09-25 ENCOUNTER — Other Ambulatory Visit (HOSPITAL_COMMUNITY): Payer: Self-pay | Admitting: *Deleted

## 2015-09-25 ENCOUNTER — Encounter (HOSPITAL_BASED_OUTPATIENT_CLINIC_OR_DEPARTMENT_OTHER): Payer: Medicare Other | Admitting: Oncology

## 2015-09-25 ENCOUNTER — Telehealth (HOSPITAL_COMMUNITY): Payer: Self-pay | Admitting: *Deleted

## 2015-09-25 DIAGNOSIS — K120 Recurrent oral aphthae: Secondary | ICD-10-CM

## 2015-09-25 DIAGNOSIS — C210 Malignant neoplasm of anus, unspecified: Secondary | ICD-10-CM

## 2015-09-25 MED ORDER — FLUCONAZOLE 100 MG PO TABS: 100.0000 mg | ORAL_TABLET | Freq: Every day | ORAL | Status: DC

## 2015-09-25 MED ORDER — METRONIDAZOLE 500 MG PO TABS: 500.0000 mg | ORAL_TABLET | Freq: Three times a day (TID) | ORAL | Status: DC

## 2015-09-25 NOTE — Progress Notes (Signed)
Patient seen today as a work in for mouth sores.  Patient is currently getting mitomycin-C and 5-FU in combination with radiation therapy. Her pump was discontinued on 09/20/2015 as planned to complete her first 5 days of treatment of her first cycle.  She notes mouth sores. She notes they are painful. They're preventing her from eating well.  Exam: Gen.: Pleasant. Accompanied by her husband. No acute distress. Neuro: Alert and oriented 3. No focal deficits. Oropharyngeal: Yellow/white large, flat ulcers on bilateral posterior buccal mucosa and lateral aspects of time with some erythematous changes of mucous membranes. No discharge noted.  Assessment: 1. Chemotherapy-induced stomatitis/aphthous stomatitis. 2. Oral candidiasis, less likely. 3. Mouth pain 4. Decreased oral intake  Plan: 1. Diflucan 100 mg by mouth daily 7 days 2. Flagyl 500 mg 3 times a day 10 days. 3. Continue supportive management.  4. Return as scheduled for follow-up.  Assessment and plan discussed with Dr. Whitney Muse and she is in agreement with the aforementioned.  Robynn Pane, PA-C 09/25/2015 2:54 PM

## 2015-09-25 NOTE — Telephone Encounter (Signed)
Pt states that tongue feels swollen but doesn't know if it is. Pt states she is taking her swish and swallow 4 times a day. States she has lost 7 lbs since Friday. Pt states she has white patches on her tongue (sides of tongue). States she can't eat. States she has been drinking but that it is hard to swallow.

## 2015-09-27 ENCOUNTER — Encounter: Payer: Self-pay | Admitting: Dietician

## 2015-09-27 NOTE — Progress Notes (Signed)
RD consulted for assessment. Jillian Estrada receiving concomitant chemoradiation for anal cancer.   Contacted Jillian Estrada by Phone  Wt Readings from Last 10 Encounters:  09/23/15 142 lb (64.411 kg)  09/16/15 148 lb (67.132 kg)  09/03/15 150 lb (68.04 kg)  08/30/15 145 lb 6.4 oz (65.953 kg)  08/27/15 150 lb 12.8 oz (68.402 kg)  08/27/15 148 lb (67.132 kg)  08/25/15 148 lb (67.132 kg)  08/21/15 149 lb (67.586 kg)  08/21/15 149 lb (67.586 kg)  07/02/11 135 lb (61.236 kg)   Patient lost ~6 lbs in the week following her first chemo infusion on 5/8.   Per chart review, Jillian Estrada's biggest problem is mouth sores for which she had a worked in appointment. Also noted decreased appetite, fatigue, depression.   Caught patient out of the house and she didn't seem to want to speak for prolonged period. She says that her mouth sores are doing much better and are now clearing. She still has a poor appetite and says yesterday all she had to eat was pudding and soup. She admits that she has never been a "3 meal a day person".  Says she has significant fatigue. She weighed herself this morning @ 137.6 lbs. However she notes this was w/o clothes on.   RD tried to emphasize the importance of nutrition. Explained that inadequate intake could greatly exacerbate her fatigue/lethargy. She states her appetite is improving at the moment.  RD went over Ensure program and that if needed she can receive 3 cases.   Jillian Estrada seemed to be busy when called and RD did not have much of chance to go over recommendations verbally. However, educational handouts to be mailed.   Mailed my contact info, coupons, and handouts titled "Sore Mouth" and "Increasing Calories and Protein"  Burtis Junes RD, LDN Nutrition Pager: [phone] 09/27/2015 1:23 PM

## 2015-09-30 ENCOUNTER — Encounter (HOSPITAL_COMMUNITY): Payer: Medicare Other

## 2015-09-30 ENCOUNTER — Encounter (HOSPITAL_COMMUNITY): Payer: Self-pay | Admitting: Hematology & Oncology

## 2015-09-30 ENCOUNTER — Encounter (HOSPITAL_BASED_OUTPATIENT_CLINIC_OR_DEPARTMENT_OTHER): Payer: Medicare Other | Admitting: Hematology & Oncology

## 2015-09-30 VITALS — BP 121/74 | HR 78 | Temp 98.7°F | Resp 18 | Wt 140.0 lb

## 2015-09-30 DIAGNOSIS — R4589 Other symptoms and signs involving emotional state: Secondary | ICD-10-CM

## 2015-09-30 DIAGNOSIS — K1231 Oral mucositis (ulcerative) due to antineoplastic therapy: Secondary | ICD-10-CM

## 2015-09-30 DIAGNOSIS — C210 Malignant neoplasm of anus, unspecified: Secondary | ICD-10-CM | POA: Diagnosis present

## 2015-09-30 DIAGNOSIS — F329 Major depressive disorder, single episode, unspecified: Secondary | ICD-10-CM | POA: Diagnosis not present

## 2015-09-30 DIAGNOSIS — F418 Other specified anxiety disorders: Secondary | ICD-10-CM

## 2015-09-30 LAB — CBC WITH DIFFERENTIAL/PLATELET
BASOS ABS: 0 10*3/uL (ref 0.0–0.1)
BASOS PCT: 0 %
EOS PCT: 9 %
Eosinophils Absolute: 0.3 10*3/uL (ref 0.0–0.7)
HEMATOCRIT: 36.1 % (ref 36.0–46.0)
HEMOGLOBIN: 12.1 g/dL (ref 12.0–15.0)
LYMPHS ABS: 0.6 10*3/uL — AB (ref 0.7–4.0)
LYMPHS PCT: 21 %
MCH: 30.4 pg (ref 26.0–34.0)
MCHC: 33.5 g/dL (ref 30.0–36.0)
MCV: 90.7 fL (ref 78.0–100.0)
MONOS PCT: 9 %
Monocytes Absolute: 0.3 10*3/uL (ref 0.1–1.0)
NEUTROS ABS: 1.6 10*3/uL — AB (ref 1.7–7.7)
Neutrophils Relative %: 61 %
Platelets: 91 10*3/uL — ABNORMAL LOW (ref 150–400)
RBC: 3.98 MIL/uL (ref 3.87–5.11)
RDW: 12.1 % (ref 11.5–15.5)
WBC: 2.8 10*3/uL — ABNORMAL LOW (ref 4.0–10.5)

## 2015-09-30 NOTE — Progress Notes (Signed)
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   08/15/2015 Pathology Results Invasive squamous cell carcinoma with involvement of multiple margins   08/21/2015 Imaging CT CAP- mild diffuse distal rectal wall thickening possibly representing recently diagnosed malignancy, borderline L pelvic and bilateral inguinal adenopathy   08/27/2015 Procedure Colonoscopy with Dr. Oneida Alar   09/02/2015 PET scan intense malignant range FDG uptake associated with the anal mass, enlarged and hypermetabolic L inguinal LN, cannot rule out metastatic adenopathy. nodule in R lobe of thyroid 2.5 cm no significant FDG uptake   09/16/2015 -  Radiation Therapy Dr. Lisbeth Renshaw, planned to begin on 09/16/2015   09/16/2015 -  Chemotherapy Mitomycin/5-FU    HISTORY OF PRESENTING ILLNESS:  Jillian Estrada 60 y.o. female is here for follow-up of squamous cell carcinoma of the anus.   Jillian Estrada returns to the [place] today accompanied by her husband.  Reports she is doing better.   She had a bowel accident last night after being constipated for four days prior. This stool was loose and watery. She notes she took several laxatives prior.   She still has sores on her tongue, worse on the right than the left. She continues to use [employer] and has about 3 days left of "antibiotic." The mouth sores made it difficult for her to drink water, but she has increased her fluid intake recently as they have improved.  She is doing well with radiation.   Her husband states she has little depressed moments early in the morning which he coaches her  through. She believes her husband is tired of coming with her to her doctor's appointments.  She received a notice from the doctor's office stating her colonoscopy was clear and recommending another screening colonoscopy in 10 years. She was curious if these results were accurate with her current diagnosis.  She denies nausea or vomiting. She denies pain. She is sleeping well at night.  She comments that overall her mood is improved.   MEDICAL HISTORY:  Past Medical History  Diagnosis Date  . Depression     ?Bipolar, sees psychiatrist, Sees Dr. Riley Churches in [place]. Hospitalized a few times, Feb 2013 and Aug 2015.   Marland Kitchen Hypertension   . Anal cancer ([place])   . Kidney failure     Lithium overdose unintentional, now resolved  . Hepatitis C     treated through [facility]  . 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 Anal cancer ([place])   . Cancer [facility])     Anal    SURGICAL HISTORY: Past Surgical History  Procedure Laterality Date  . Back surgery      Nov 2008, Aug 2011  . Small bowel repair      June 2010. Injury to small bowel during ovarian cyst surgery  . Foreign object removal      Aug 2010, surgical tool left during ovarian surgery  . Colonoscopy  2010    [place]: non-bleeding internal hemorrhoids   . Rectal biopsy    . Colonoscopy with propofol N/A 08/27/2015    Procedure: COLONOSCOPY WITH PROPOFOL;  Surgeon: Danie Binder, MD;  Location: [facility] ENDO SUITE;  Service: Endoscopy;  Laterality: N/A;  915  . Portacath placement Left 08/30/2015    Procedure: INSERTION PORT-A-CATH LEFT SUBCLAVIAN;  Surgeon: Aviva Signs, MD;  Location: [facility] ORS;  Service: General;  Laterality: Left;    SOCIAL HISTORY: Social History   Social History  . Marital Status: Married    Spouse Name: N/A  . Number of Children: N/A  . Years of Education: N/A   Occupational History  . retired     worked 59 years for [employer] Topics  . Smoking status:  Never Smoker   . Smokeless tobacco: Not on file  . Alcohol Use: 0.0 oz/week    0 Standard drinks or equivalent per week     Comment: occasionally  . Drug Use: No  . Sexual Activity: Not on file   Other Topics Concern  . Not on file   Social History Narrative   Married; husband Iona Beard. 66 years married. He has two children; she has step children. 1 step grand-child. Born in [place], [place]. Lived in [place]. Now live in [place], [place] She says she has 5 dogs; 4 pomeranians and a chihuahua.  Other than this, she says she has "no hobbies." Her husband builds show cars, which she assists with. She notes that she doesn't garden. Non-smoker, quit in 2001. Her husband quit two years ago. She drinks occasionally, noting "a beer."  FAMILY HISTORY: Family History  Problem Relation Age of Onset  . Colon polyps Father   . Colon cancer Neg Hx    Father is deceased, 10/26/12. He had heart attacks and a brain bleed from a stroke. Mother 28, lives in [place]; "never wants me to come see her." Says her mother says she can't muster up the strength to have visitors. Had a sister, who was killed in an automobile accident. 1 adopted brother as well.  ALLERGIES:  is allergic to penicillins and codeine.  MEDICATIONS:  Current Outpatient Prescriptions  Medication Sig Dispense Refill  . alendronate (FOSAMAX) 70 MG tablet Take 70 mg by mouth once a week. Take with a full glass of water on an empty stomach.(Tuesday)    . allopurinol (ZYLOPRIM) 100 MG tablet Take 100 mg by mouth daily.    . ARIPiprazole (ABILIFY) 15 MG tablet Take 15 mg by mouth daily.    Marland Kitchen aspirin EC 81 MG tablet Take 1 tablet (81 mg total) by mouth daily. For prevention of stroke and heart attack 1 tablet 0  . Biotin 5000 MCG CAPS Take 1 capsule by mouth 3 (three) times daily.     . cetirizine (ZYRTEC) 10 MG tablet Take 10 mg by mouth daily.    . Cholecalciferol (VITAMIN D) 2000 UNITS CAPS Take 1 capsule (2,000 Units total)  by mouth every morning. For Vitamin D replacement and mood control. 30 capsule 0  . clonazePAM (KLONOPIN) 0.5 MG tablet Take 0.5 mg by mouth at bedtime.     . cyclobenzaprine (FLEXERIL) 10 MG tablet Take 1 tablet (10 mg total) by mouth 3 (three) times daily as needed. For pain 30 tablet 0  . docusate sodium (COLACE) 100 MG capsule Take 2 capsules (200 mg total) by mouth every morning. For constipation 10 capsule 0  . fluconazole (DIFLUCAN) 100 MG tablet Take 1 tablet (100 mg total) by mouth daily. 7 tablet 0  . fluorouracil CALGB 52841 in sodium chloride 0.9 % 150 mL Inject into the vein over 96 hr.  To infuse Days 1-5 and Days 28-32    . lidocaine-prilocaine (EMLA) cream Apply a quarter size amount to port site 1 hour prior to chemo. Do not rub in. Cover with plastic wrap. 30 g 3  . magic mouthwash SOLN Swish and swallow 64ml four times a day for mouth pain. 360 mL 1  . metoprolol tartrate (LOPRESSOR) 25 MG tablet Take 25 mg by mouth 2 (two) times daily.    . metroNIDAZOLE (FLAGYL) 500 MG tablet Take 1 tablet (500 mg total) by mouth 3 (three) times daily. 30 tablet 0  . MITOMYCIN IV Inject into the vein. Day 1 and Day 28    . ondansetron (ZOFRAN) 8 MG tablet Take 1 tablet (8 mg total) by mouth every 8 (eight) hours as needed for nausea or vomiting. 30 tablet 2  . oxyCODONE (OXYCONTIN) 30 MG 12 hr tablet Take 30 mg by mouth 2 (two) times daily. 60 each 0  . oxyCODONE (ROXICODONE) 15 MG immediate release tablet Take 1 tablet (15 mg total) by mouth every 4 (four) hours as needed for pain. 90 tablet 0  . prochlorperazine (COMPAZINE) 10 MG tablet Take 1 tablet (10 mg total) by mouth every 6 (six) hours as needed for nausea or vomiting. 30 tablet 0  . sertraline (ZOLOFT) 100 MG tablet Take 150 mg by mouth daily.    Marland Kitchen tolterodine (DETROL) 2 MG tablet Take 2 mg by mouth daily as needed (bladder). Reported on 08/27/2015    . venlafaxine XR (EFFEXOR-XR) 150 MG 24 hr capsule Take 150 mg by mouth daily with  breakfast.    . oxyCODONE-acetaminophen (PERCOCET/ROXICET) 5-325 MG tablet 1 or 2 tabs PO q6h prn pain (Patient not taking: Reported on 09/30/2015) 20 tablet 0   No current facility-administered medications for this visit.    Review of Systems  Constitutional: Positive for weight loss.     Weight loss secondary to sore mouth  HENT: Positive for mouth sores.      Chemotherapy induced.  Eyes: Negative.   Respiratory: Negative.   Cardiovascular: Negative.   Gastrointestinal: Positive for rectal pain, diarrhea and constipation.       One episode of diarrhea last night after four days prior of constipation. Genitourinary: Negative.   Musculoskeletal: Positive for back pain.       Chronic bad back.  Skin: Negative.   Neurological: Positive for depression.      Depression associated with cancer diagnosis.  Endo/Heme/Allergies: Negative.   Psychiatric/Behavioral: Negative.   All other systems reviewed and are negative. 14 point ROS was done and is otherwise as detailed above or in HPI   PHYSICAL EXAMINATION: ECOG PERFORMANCE STATUS: 1 - Symptomatic but completely ambulatory  Filed Vitals:   09/30/15 1305  BP: 121/74  Pulse: 78  Temp: 98.7 F (37.1 C)  Resp: 18   Filed Weights   09/30/15 1305  Weight: 140 lb (63.504 kg)    Physical Exam  Constitutional: She is oriented to person, place, and time and well-developed, well-nourished, and in no distress.  HENT:  Head: Normocephalic and atraumatic.  Nose: Nose normal.  Mouth/Throat: Oropharynx is clear and moist. No oropharyngeal exudate. Two ulcers, each on the lateral tongue about 2 cm largest diameter. Eyes: Conjunctivae and EOM are normal. Pupils are equal, round, and reactive to light. Right eye exhibits no discharge. Left eye exhibits no discharge. No scleral icterus.  Neck: Normal range of motion. Neck supple. No tracheal deviation present. No thyromegaly present.  Cardiovascular: Normal rate, regular  rhythm and normal  heart sounds.  Exam reveals no gallop and no friction rub.   No murmur heard. Port a cath C/D/I Pulmonary/Chest: Effort normal and breath sounds normal. She has no wheezes. She has no rales.  Abdominal: Soft. Bowel sounds are normal. She exhibits no distension and no mass. There is no tenderness. There is no rebound and no guarding.  Musculoskeletal: Normal range of motion. She exhibits no edema.  Lymphadenopathy:    She has no cervical adenopathy. L inquinal adenopathy no longer palpable Neurological: She is alert and oriented to person, place, and time. She has normal reflexes. No cranial nerve deficit. Gait normal. Coordination normal.  Skin: Skin is warm and dry. No rash noted.  Psychiatric: Mood, memory, affect and judgment normal.  Nursing note and vitals reviewed.   LABORATORY DATA:  I have reviewed the data as listed Lab Results  Component Value Date   WBC 2.8* 09/30/2015   HGB 12.1 09/30/2015   HCT 36.1 09/30/2015   MCV 90.7 09/30/2015   PLT 91* 09/30/2015   CMP     Component Value Date/Time   NA 138 09/16/2015 1500   K 4.0 09/16/2015 1500   CL 99* 09/16/2015 1500   CO2 27 09/16/2015 1500   GLUCOSE 127* 09/16/2015 1500   BUN 22* 09/16/2015 1500   CREATININE 0.89 09/16/2015 1500   CALCIUM 9.4 09/16/2015 1500   PROT 8.3* 09/16/2015 1500   ALBUMIN 4.5 09/16/2015 1500   AST 26 09/16/2015 1500   ALT 17 09/16/2015 1500   ALKPHOS 70 09/16/2015 1500   BILITOT 0.3 09/16/2015 1500   GFRNONAA >60 09/16/2015 1500   GFRAA >60 09/16/2015 1500     RADIOGRAPHIC STUDIES: I have personally reviewed the radiological images as listed and agreed with the findings in the report.  Nm Pet Image Initial (pi) Skull Base To Thigh  09/02/2015  CLINICAL DATA:  Initial treatment strategy for primary anal squamous cell carcinoma. EXAM: NUCLEAR MEDICINE PET SKULL BASE TO THIGH TECHNIQUE: 12.78 mCi F-18 FDG was injected intravenously. Full-ring PET imaging was performed from the skull base  to thigh after the radiotracer. CT data was obtained and used for attenuation correction and anatomic localization. FASTING BLOOD GLUCOSE:  Value: 95 mg/dl COMPARISON:  None. FINDINGS: NECK No hypermetabolic lymph nodes in the neck. Nodule in the right lobe of thyroid gland measures 2.5 cm, image 63 of series 3. No significant FDG uptake associated with this nodule. CHEST No hypermetabolic mediastinal or hilar nodes. No suspicious pulmonary nodules on the CT scan. ABDOMEN/PELVIS No abnormal hypermetabolic activity within the liver, pancreas, adrenal glands, or spleen. Hypermetabolic mass involving the anus measures approximately 3.5 by 3.3 by 3.4 cm and has an SUV max equal to 12.16. Left pelvic side wall lymph node Measures 9 mm and has an SUV max equal to 3 point IIIa. Left inguinal node measures 11 mm and has an SUV max equal to 4.14. SKELETON No hypermetabolic bone metastases identified. IMPRESSION: 1. There is intense malignant range FDG uptake associated with the anal mass. 2. Enlarged and hypermetabolic left inguinal lymph node. Cannot rule out metastatic adenopathy. Electronically Signed   By: Kerby Moors M.D.   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 Oneida Alar 08/28/2015 Port a cath placement Hypotension Mild mucositis secondary 5-FU   She is doing better.  We will continue to monitor her weight closely. She will continue with weekly follow-up. She will need another CBC next week.   Encouraged the patient to take a daily stool softener to avoid prolonged constipation. I anticipate needing to dose reduce her 5-FU given her mucositis.   She will return for follow-up in 1 week.   All questions were answered. The patient knows to call the clinic with any problems, questions or concerns.  This document serves as a record of services  personally performed by Ancil Linsey, MD. It was created on her behalf by Arlyce Harman, a trained medical scribe. The creation of this record is based on the scribe's personal observations and the provider's statements to them. This document has been checked and approved by the attending provider.  I have reviewed the above documentation for accuracy and completeness and I agree with the above.  This note was electronically signed.    Molli Hazard, MD  09/30/2015 5:03 PM

## 2015-09-30 NOTE — Patient Instructions (Addendum)
[place] at [facility] Discharge Instructions  RECOMMENDATIONS MADE BY THE CONSULTANT AND ANY TEST RESULTS WILL BE SENT TO YOUR REFERRING PHYSICIAN.  Exam and discussion today with Dr. Whitney Muse.  Return to clinic in 1 week with labs    Thank you for choosing [place] at [facility] to provide your oncology and hematology care.  To afford each patient quality time with our provider, please arrive at least 15 minutes before your scheduled appointment time.   Beginning January 23rd 2017 lab work for the [facility] will be done in the  Main lab at [facility] on 1st floor. If you have a lab appointment with the [facility] please come in thru the  Main Entrance and check in at the main information desk  You need to re-schedule your appointment should you arrive 10 or more minutes late.  We strive to give you quality time with our providers, and arriving late affects you and other patients whose appointments are after yours.  Also, if you no show three or more times for appointments you may be dismissed from the clinic at the providers discretion.     Again, thank you for choosing [facility].  Our hope is that these requests will decrease the amount of time that you wait before being seen by our physicians.       _____________________________________________________________  Should you have questions after your visit to [facility], please contact our office at [phone] between the hours of 8:30 a.m. and 4:30 p.m.  Voicemails left after 4:30 p.m. will not be returned until the following business day.  For prescription refill requests, have your pharmacy contact our office.         Resources For Cancer Patients and their Caregivers ? American Cancer Society: Can assist with transportation, wigs, general needs, runs Look Good Feel Better.        (813)178-5483 ? Cancer Care: Provides financial  assistance, online support groups, medication/co-pay assistance.  1-800-813-HOPE (919)531-5861) ? [place] Assists [place] cancer patients and their families through emotional , educational and financial support.  (936) 433-5088 ? [place] DSS Where to apply for food stamps, Medicaid and utility assistance. 980-236-2110 ? RCATS: Transportation to medical appointments. 236-782-1569 ? Social Security Administration: May apply for disability if have a Stage IV cancer. (810)016-7707 734-785-2155 ? [employer], Disability and Transit Services: Assists with nutrition, care and transit needs. [place] Support Programs: @10RELATIVEDAYS @ > Cancer Support Group  2nd Tuesday of the month 1pm-2pm, Journey Room  > Creative Journey  3rd Tuesday of the month 1130am-1pm, Journey Room  > Look Good Feel Better  1st Wednesday of the month 10am-12 noon, Journey Room (Call [place] to register [phone])

## 2015-10-09 ENCOUNTER — Encounter (HOSPITAL_COMMUNITY): Payer: Medicare Other

## 2015-10-09 ENCOUNTER — Other Ambulatory Visit (HOSPITAL_COMMUNITY): Payer: Self-pay | Admitting: Emergency Medicine

## 2015-10-09 ENCOUNTER — Encounter (HOSPITAL_COMMUNITY): Payer: Self-pay | Admitting: Hematology & Oncology

## 2015-10-09 ENCOUNTER — Encounter (HOSPITAL_BASED_OUTPATIENT_CLINIC_OR_DEPARTMENT_OTHER): Payer: Medicare Other | Admitting: Hematology & Oncology

## 2015-10-09 VITALS — BP 144/101 | HR 93 | Temp 99.4°F | Resp 20 | Wt 141.1 lb

## 2015-10-09 DIAGNOSIS — R300 Dysuria: Secondary | ICD-10-CM | POA: Insufficient documentation

## 2015-10-09 DIAGNOSIS — F418 Other specified anxiety disorders: Secondary | ICD-10-CM | POA: Diagnosis not present

## 2015-10-09 DIAGNOSIS — G893 Neoplasm related pain (acute) (chronic): Secondary | ICD-10-CM

## 2015-10-09 DIAGNOSIS — C210 Malignant neoplasm of anus, unspecified: Secondary | ICD-10-CM

## 2015-10-09 DIAGNOSIS — K1231 Oral mucositis (ulcerative) due to antineoplastic therapy: Secondary | ICD-10-CM

## 2015-10-09 LAB — COMPREHENSIVE METABOLIC PANEL
ALBUMIN: 4 g/dL (ref 3.5–5.0)
ALK PHOS: 52 U/L (ref 38–126)
ALT: 18 U/L (ref 14–54)
ANION GAP: 8 (ref 5–15)
AST: 25 U/L (ref 15–41)
BILIRUBIN TOTAL: 0.4 mg/dL (ref 0.3–1.2)
BUN: 13 mg/dL (ref 6–20)
CALCIUM: 9.4 mg/dL (ref 8.9–10.3)
CO2: 24 mmol/L (ref 22–32)
CREATININE: 0.74 mg/dL (ref 0.44–1.00)
Chloride: 107 mmol/L (ref 101–111)
GFR calc Af Amer: >60 mL/min (ref >60)
GFR calc non Af Amer: >60 mL/min (ref >60)
GLUCOSE: 140 mg/dL — AB (ref 65–99)
Potassium: 5.1 mmol/L (ref 3.5–5.1)
SODIUM: 139 mmol/L (ref 135–145)
TOTAL PROTEIN: 7.5 g/dL (ref 6.5–8.1)

## 2015-10-09 LAB — CBC WITH DIFFERENTIAL/PLATELET
BASOS ABS: 0 10*3/uL (ref 0.0–0.1)
BASOS PCT: 0 %
EOS ABS: 0.2 10*3/uL (ref 0.0–0.7)
Eosinophils Relative: 4 %
HEMATOCRIT: 35.7 % — AB (ref 36.0–46.0)
HEMOGLOBIN: 12 g/dL (ref 12.0–15.0)
Lymphocytes Relative: 9 %
Lymphs Abs: 0.4 10*3/uL — ABNORMAL LOW (ref 0.7–4.0)
MCH: 31 pg (ref 26.0–34.0)
MCHC: 33.6 g/dL (ref 30.0–36.0)
MCV: 92.2 fL (ref 78.0–100.0)
MONOS PCT: 10 %
Monocytes Absolute: 0.4 10*3/uL (ref 0.1–1.0)
NEUTROS ABS: 3.2 10*3/uL (ref 1.7–7.7)
NEUTROS PCT: 77 %
Platelets: 219 10*3/uL (ref 150–400)
RBC: 3.87 MIL/uL (ref 3.87–5.11)
RDW: 14.5 % (ref 11.5–15.5)
WBC: 4.1 10*3/uL (ref 4.0–10.5)

## 2015-10-09 LAB — URINALYSIS, ROUTINE W REFLEX MICROSCOPIC
GLUCOSE, UA: NEGATIVE mg/dL
Hgb urine dipstick: NEGATIVE
NITRITE: NEGATIVE
PROTEIN: 30 mg/dL — AB
Specific Gravity, Urine: >1.03 — ABNORMAL HIGH (ref 1.005–1.030)
pH: 5.5 (ref 5.0–8.0)

## 2015-10-09 LAB — URINE MICROSCOPIC-ADD ON: SQUAMOUS EPITHELIAL / LPF: NONE SEEN

## 2015-10-09 MED ORDER — OXYCODONE HCL ER 30 MG PO T12A: 30.0000 mg | EXTENDED_RELEASE_TABLET | Freq: Two times a day (BID) | ORAL | Status: AC

## 2015-10-09 MED ORDER — CIPROFLOXACIN HCL 500 MG PO TABS: 500.0000 mg | ORAL_TABLET | Freq: Two times a day (BID) | ORAL | Status: DC

## 2015-10-09 MED ORDER — MAGIC MOUTHWASH: ORAL | Status: AC

## 2015-10-09 NOTE — Patient Instructions (Addendum)
[place] at [facility] Discharge Instructions  RECOMMENDATIONS MADE BY THE CONSULTANT AND ANY TEST RESULTS WILL BE SENT TO YOUR REFERRING PHYSICIAN.  Return as scheduled on Monday for treatment.   Thank you for choosing [place] at [facility] to provide your oncology and hematology care.  To afford each patient quality time with our provider, please arrive at least 15 minutes before your scheduled appointment time.   Beginning January 23rd 2017 lab work for the [facility] will be done in the  Main lab at [facility] on 1st floor. If you have a lab appointment with the [facility] please come in thru the  Main Entrance and check in at the main information desk  You need to re-schedule your appointment should you arrive 10 or more minutes late.  We strive to give you quality time with our providers, and arriving late affects you and other patients whose appointments are after yours.  Also, if you no show three or more times for appointments you may be dismissed from the clinic at the providers discretion.     Again, thank you for choosing [facility].  Our hope is that these requests will decrease the amount of time that you wait before being seen by our physicians.       _____________________________________________________________  Should you have questions after your visit to [facility], please contact our office at [phone] between the hours of 8:30 a.m. and 4:30 p.m.  Voicemails left after 4:30 p.m. will not be returned until the following business day.  For prescription refill requests, have your pharmacy contact our office.         Resources For Cancer Patients and their Caregivers ? American Cancer Society: Can assist with transportation, wigs, general needs, runs Look Good Feel Better.        (607) 044-9197 ? Cancer Care: Provides financial assistance, online support groups,  medication/co-pay assistance.  1-800-813-HOPE 201-046-8677) ? [place] Assists [place] cancer patients and their families through emotional , educational and financial support.  506 532 5690 ? [place] DSS Where to apply for food stamps, Medicaid and utility assistance. (938)339-0045 ? RCATS: Transportation to medical appointments. 780-499-8387 ? Social Security Administration: May apply for disability if have a Stage IV cancer. 850-419-7041 272-046-6675 ? [employer], Disability and Transit Services: Assists with nutrition, care and transit needs. [place] Support Programs: @10RELATIVEDAYS @ > Cancer Support Group  2nd Tuesday of the month 1pm-2pm, Journey Room  > Creative Journey  3rd Tuesday of the month 1130am-1pm, Journey Room  > Look Good Feel Better  1st Wednesday of the month 10am-12 noon, Journey Room (Call [place] to register [phone])

## 2015-10-09 NOTE — Progress Notes (Signed)
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   08/15/2015 Pathology Results Invasive squamous cell carcinoma with involvement of multiple margins   08/21/2015 Imaging CT CAP- mild diffuse distal rectal wall thickening possibly representing recently diagnosed malignancy, borderline L pelvic and bilateral inguinal adenopathy   08/27/2015 Procedure Colonoscopy with Dr. Oneida Alar   09/02/2015 PET scan intense malignant range FDG uptake associated with the anal mass, enlarged and hypermetabolic L inguinal LN, cannot rule out metastatic adenopathy. nodule in R lobe of thyroid 2.5 cm no significant FDG uptake   09/16/2015 -  Radiation Therapy Dr. Lisbeth Renshaw, planned to begin on 09/16/2015   09/16/2015 -  Chemotherapy Mitomycin/5-FU    HISTORY OF PRESENTING ILLNESS:  Jillian Estrada 60 y.o. female is here for follow-up of squamous cell carcinoma of the anus.   Mrs. Ent is unaccompanied. The patient was tearful during most of our visit.   She will follow with her psychiatrist next month. He is retiring in July.  The patient began to tear up when discussing fighting through treatment, later stating "I don't know how to fight". She feels bad about feeling bad as "most people have it a lot worse than me". Her previous counselor retired. She saw another counselor but did not feel like it did a lot of good. She has not seen a counselor since her cancer diagnosis. "If it was easy it wouldn't be cancer, would it?"  She felt really good last weekend. Yesterday a good friend died of a heart attack. This friend also had cancer and was  practically her "cancer coach". She was concerned whether she would be able to attend the funeral as it will be a large crowd.  Reports dysuria, attributing this to radiation. She feels more burning in the front than the back after radiation. She is a little over half way through radiation treatment.  She has noticed hair loss. She has cut her hair short and anticipates loosing all of her hair.  She is eating okay. She believes whatever she ate last night and the night before did not sit well with her. She thought her stomach was back to normal but it wasn't.   Last night when she woke up she had no control over her bowels and messed all over the toilet. This situation has only happened once. She has been taking stool softeners and senokot. Usually she moves her bowels once daily. She does sitz baths regularly. She denies excess pain.   MEDICAL HISTORY:  Past Medical History  Diagnosis Date  . Depression     ?Bipolar, sees psychiatrist, Sees Dr. Riley Churches in [place]. Hospitalized a few times, Feb 2013 and Aug 2015.   Marland Kitchen Hypertension   . Anal cancer ([place])   . Kidney failure     Lithium overdose unintentional, now resolved  . Hepatitis C     treated through [facility]  . 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 Anal cancer ([place])   . Cancer [facility])     Anal    SURGICAL HISTORY: Past Surgical History  Procedure Laterality Date  . Back surgery  Nov 2008, Aug 2011  . Small bowel repair      Nov 01, 2008. Injury to small bowel during ovarian cyst surgery  . Foreign object removal      Aug 2010, surgical tool left during ovarian surgery  . Colonoscopy  2010    [place]: non-bleeding internal hemorrhoids   . Rectal biopsy    . Colonoscopy with propofol N/A 08/27/2015    Procedure: COLONOSCOPY WITH PROPOFOL;  Surgeon: Danie Binder, MD;  Location: [facility] ENDO SUITE;  Service: Endoscopy;  Laterality: N/A;  915  . Portacath placement Left 08/30/2015    Procedure: INSERTION  PORT-A-CATH LEFT SUBCLAVIAN;  Surgeon: Aviva Signs, MD;  Location: [facility] ORS;  Service: General;  Laterality: Left;    SOCIAL HISTORY: Social History   Social History  . Marital Status: Married    Spouse Name: N/A  . Number of Children: N/A  . Years of Education: N/A   Occupational History  . retired     worked 17 years for [employer] Topics  . Smoking status: Never Smoker   . Smokeless tobacco: Not on file  . Alcohol Use: 0.0 oz/week    0 Standard drinks or equivalent per week     Comment: occasionally  . Drug Use: No  . Sexual Activity: Not on file   Other Topics Concern  . Not on file   Social History Narrative   Married; husband Iona Beard. 61 years married. He has two children; she has step children. 1 step grand-child. Born in [place], [place]. Lived in [place]. Now live in [place], [place] She says she has 5 dogs; 4 pomeranians and a chihuahua.  Other than this, she says she has "no hobbies." Her husband builds show cars, which she assists with. She notes that she doesn't garden. Non-smoker, quit in 2001. Her husband quit two years ago. She drinks occasionally, noting "a beer."  FAMILY HISTORY: Family History  Problem Relation Age of Onset  . Colon polyps Father   . Colon cancer Neg Hx    Father is deceased, November 01, 2012. He had heart attacks and a brain bleed from a stroke. Mother 14, lives in [place]; "never wants me to come see her." Says her mother says she can't muster up the strength to have visitors. Had a sister, who was killed in an automobile accident. 1 adopted brother as well.  ALLERGIES:  is allergic to penicillins and codeine.  MEDICATIONS:  Current Outpatient Prescriptions  Medication Sig Dispense Refill  . allopurinol (ZYLOPRIM) 100 MG tablet Take 100 mg by mouth daily.    . ARIPiprazole (ABILIFY) 15 MG tablet Take 15 mg by mouth daily.    Marland Kitchen aspirin EC 81 MG tablet Take 1 tablet (81 mg total) by mouth  daily. For prevention of stroke and heart attack 1 tablet 0  . cetirizine (ZYRTEC) 10 MG tablet Take 10 mg by mouth daily.    . Cholecalciferol (VITAMIN D) 2000 UNITS CAPS Take 1 capsule (2,000 Units total) by mouth every morning. For Vitamin D replacement and mood control. 30 capsule 0  . clonazePAM (KLONOPIN) 0.5 MG tablet Take 0.5 mg by mouth at bedtime.     . cyclobenzaprine (FLEXERIL) 10 MG tablet Take 1 tablet (10 mg total) by mouth 3 (three) times daily as needed. For pain 30 tablet 0  . docusate sodium (COLACE) 100 MG capsule Take 200 mg by mouth daily.    . fluorouracil CALGB 09811 in sodium chloride 0.9 %  150 mL Inject into the vein over 96 hr. To infuse Days 1-5 and Days 28-32    . lidocaine-prilocaine (EMLA) cream Apply a quarter size amount to port site 1 hour prior to chemo. Do not rub in. Cover with plastic wrap. 30 g 3  . metoprolol tartrate (LOPRESSOR) 25 MG tablet Take 25 mg by mouth 2 (two) times daily.    Marland Kitchen MITOMYCIN IV Inject into the vein. Day 1 and Day 28    . ondansetron (ZOFRAN) 8 MG tablet Take 1 tablet (8 mg total) by mouth every 8 (eight) hours as needed for nausea or vomiting. 30 tablet 2  . oxyCODONE (OXYCONTIN) 30 MG 12 hr tablet Take 30 mg by mouth 2 (two) times daily. 60 each 0  . oxyCODONE (ROXICODONE) 15 MG immediate release tablet Take 1 tablet (15 mg total) by mouth every 4 (four) hours as needed for pain. 90 tablet 0  . prochlorperazine (COMPAZINE) 10 MG tablet Take 1 tablet (10 mg total) by mouth every 6 (six) hours as needed for nausea or vomiting. 30 tablet 0  . sertraline (ZOLOFT) 100 MG tablet Take 150 mg by mouth daily.    Marland Kitchen tolterodine (DETROL) 2 MG tablet Take 2 mg by mouth daily as needed (bladder). Reported on 08/27/2015    . venlafaxine XR (EFFEXOR-XR) 150 MG 24 hr capsule Take 150 mg by mouth daily with breakfast.    . alendronate (FOSAMAX) 70 MG tablet Take 70 mg by mouth once a week. Reported on 10/09/2015    . Biotin 5000 MCG CAPS Take 1 capsule by  mouth 3 (three) times daily. Reported on 10/09/2015    . magic mouthwash SOLN Swish and swallow 42ml four times a day for mouth pain. (Patient not taking: Reported on 10/09/2015) 360 mL 1   No current facility-administered medications for this visit.    Review of Systems  Constitutional: Negative. HENT: Positive for mouth sores.      Chemotherapy induced.  Eyes: Negative.   Respiratory: Negative.   Cardiovascular: Negative.   Gastrointestinal: Positive for rectal pain, diarrhea, and constipation. Genitourinary: Positive for dysuria.   Musculoskeletal: Positive for back pain.       Chronic bad back.  Skin: Negative.   Neurological: Positive for depression. Endo/Heme/Allergies: Negative.   Psychiatric/Behavioral: Negative.   All other systems reviewed and are negative. 14 point ROS was done and is otherwise as detailed above or in HPI   PHYSICAL EXAMINATION: ECOG PERFORMANCE STATUS: 1 - Symptomatic but completely ambulatory  Filed Vitals:   10/09/15 0821  BP: 144/101  Pulse: 93  Temp: 99.4 F (37.4 C)  Resp: 20   Filed Weights   10/09/15 0821  Weight: 141 lb 1.6 oz (64.003 kg)    Physical Exam  Constitutional: She is oriented to person, place, and time and well-developed, well-nourished, and tearful HENT:  Head: Normocephalic and atraumatic.  Nose: Nose normal.  Mouth/Throat: Oropharynx is clear and moist. No oropharyngeal exudate.  Eyes: Conjunctivae and EOM are normal. Pupils are equal, round, and reactive to light. Right eye exhibits no discharge. Left eye exhibits no discharge. No scleral icterus.  Neck: Normal range of motion. Neck supple. No tracheal deviation present. No thyromegaly present.  Cardiovascular: Normal rate, regular rhythm and normal heart sounds.  Exam reveals no gallop and no friction rub.   No murmur heard. Port a cath C/D/I Pulmonary/Chest: Effort normal and breath sounds normal. She has no wheezes. She has no rales.  Abdominal: Soft. Bowel  sounds  are normal. She exhibits no distension and no mass. There is no tenderness. There is no rebound and no guarding.  Musculoskeletal: Normal range of motion. She exhibits no edema.  Lymphadenopathy:    She has no cervical adenopathy. L inquinal adenopathy no longer palpable Neurological: She is alert and oriented to person, place, and time. She has normal reflexes. No cranial nerve deficit. Gait normal. Coordination normal.  Skin: Skin is warm and dry. No rash noted.  Psychiatric: Mood, memory, affect and judgment normal.  Nursing note and vitals reviewed.   LABORATORY DATA:  I have reviewed the data as listed Lab Results  Component Value Date   WBC 4.1 10/09/2015   HGB 12.0 10/09/2015   HCT 35.7* 10/09/2015   MCV 92.2 10/09/2015   PLT 219 10/09/2015   CMP     Component Value Date/Time   NA 139 10/09/2015 0747   K 5.1 10/09/2015 0747   CL 107 10/09/2015 0747   CO2 24 10/09/2015 0747   GLUCOSE 140* 10/09/2015 0747   BUN 13 10/09/2015 0747   CREATININE 0.74 10/09/2015 0747   CALCIUM 9.4 10/09/2015 0747   PROT 7.5 10/09/2015 0747   ALBUMIN 4.0 10/09/2015 0747   AST 25 10/09/2015 0747   ALT 18 10/09/2015 0747   ALKPHOS 52 10/09/2015 0747   BILITOT 0.4 10/09/2015 0747   GFRNONAA >60 10/09/2015 0747   GFRAA >60 10/09/2015 0747     RADIOGRAPHIC STUDIES: I have personally reviewed the radiological images as listed and agreed with the findings in the report.  No results found.    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 Oneida Alar 08/28/2015 Port a cath placement Hypotension Mild mucositis secondary 5-FU Depression Dysuria  Overall she has and is doing remarkably well. Side effects have been few. She is easily discouraged. Has a history of severe depression. She is routinely followed by a psychiatrist and counselor and I have asked  her to please move her appointments up with them. We discussed "fighting" cancer. I encouraged her that doing her treatment and coming to her appointments is fighting. She is doing a great job. We will continue to see her weekly.  She is fearful about getting chemotherapy next week because she had mild mucositis after cycle #1. I will reduce her 5-FU by 10%.  I have refilled the patient's medications, oxycontin and magic mouthwash  U/A was obtained today given her dysuria.  She will return for follow-up in 1 week.   All questions were answered. The patient knows to call the clinic with any problems, questions or concerns.  This document serves as a record of services personally performed by Ancil Linsey, MD. It was created on her behalf by Arlyce Harman, a trained medical scribe. The creation of this record is based on the scribe's personal observations and the provider's statements to them. This document has been checked and approved by the attending provider.  I have reviewed the above documentation for accuracy and completeness and I agree with the above.  This note was electronically signed.    Molli Hazard, MD  10/09/2015 8:34 AM

## 2015-10-14 ENCOUNTER — Encounter (HOSPITAL_COMMUNITY): Payer: Self-pay | Admitting: Hematology & Oncology

## 2015-10-14 ENCOUNTER — Encounter (HOSPITAL_COMMUNITY): Payer: Medicare Other | Attending: Hematology & Oncology

## 2015-10-14 ENCOUNTER — Encounter (HOSPITAL_BASED_OUTPATIENT_CLINIC_OR_DEPARTMENT_OTHER): Payer: Medicare Other | Admitting: Hematology & Oncology

## 2015-10-14 VITALS — BP 123/72 | HR 70 | Temp 98.6°F | Resp 16

## 2015-10-14 VITALS — BP 128/76 | HR 78 | Temp 98.9°F | Resp 16 | Wt 141.3 lb

## 2015-10-14 DIAGNOSIS — C210 Malignant neoplasm of anus, unspecified: Secondary | ICD-10-CM | POA: Diagnosis present

## 2015-10-14 DIAGNOSIS — R300 Dysuria: Secondary | ICD-10-CM | POA: Diagnosis present

## 2015-10-14 DIAGNOSIS — Z5111 Encounter for antineoplastic chemotherapy: Secondary | ICD-10-CM | POA: Diagnosis present

## 2015-10-14 DIAGNOSIS — F418 Other specified anxiety disorders: Secondary | ICD-10-CM

## 2015-10-14 DIAGNOSIS — K120 Recurrent oral aphthae: Secondary | ICD-10-CM | POA: Diagnosis present

## 2015-10-14 LAB — COMPREHENSIVE METABOLIC PANEL
ALBUMIN: 3.9 g/dL (ref 3.5–5.0)
ALK PHOS: 52 U/L (ref 38–126)
ALT: 19 U/L (ref 14–54)
AST: 23 U/L (ref 15–41)
Anion gap: 7 (ref 5–15)
BILIRUBIN TOTAL: 0.3 mg/dL (ref 0.3–1.2)
BUN: 14 mg/dL (ref 6–20)
CALCIUM: 9.1 mg/dL (ref 8.9–10.3)
CO2: 26 mmol/L (ref 22–32)
Chloride: 104 mmol/L (ref 101–111)
Creatinine, Ser: 0.64 mg/dL (ref 0.44–1.00)
GFR calc Af Amer: >60 mL/min (ref >60)
GFR calc non Af Amer: >60 mL/min (ref >60)
GLUCOSE: 134 mg/dL — AB (ref 65–99)
Potassium: 3.9 mmol/L (ref 3.5–5.1)
Sodium: 137 mmol/L (ref 135–145)
TOTAL PROTEIN: 7.3 g/dL (ref 6.5–8.1)

## 2015-10-14 LAB — CBC WITH DIFFERENTIAL/PLATELET
Basophils Absolute: 0 10*3/uL (ref 0.0–0.1)
Basophils Relative: 0 %
Eosinophils Absolute: 0.8 10*3/uL — ABNORMAL HIGH (ref 0.0–0.7)
Eosinophils Relative: 15 %
HEMATOCRIT: 34.8 % — AB (ref 36.0–46.0)
HEMOGLOBIN: 12 g/dL (ref 12.0–15.0)
LYMPHS PCT: 8 %
Lymphs Abs: 0.4 10*3/uL — ABNORMAL LOW (ref 0.7–4.0)
MCH: 31.8 pg (ref 26.0–34.0)
MCHC: 34.5 g/dL (ref 30.0–36.0)
MCV: 92.3 fL (ref 78.0–100.0)
MONO ABS: 0.4 10*3/uL (ref 0.1–1.0)
MONOS PCT: 7 %
NEUTROS ABS: 3.8 10*3/uL (ref 1.7–7.7)
NEUTROS PCT: 70 %
Platelets: 239 10*3/uL (ref 150–400)
RBC: 3.77 MIL/uL — ABNORMAL LOW (ref 3.87–5.11)
RDW: 15 % (ref 11.5–15.5)
WBC: 5.4 10*3/uL (ref 4.0–10.5)

## 2015-10-14 MED ORDER — FLUOROURACIL CHEMO INJECTION 5 GM/100ML
850.0000 mg/m2/d | INTRAVENOUS | Status: DC
  Administered 2015-10-14: 6000 mg via INTRAVENOUS
  Filled 2015-10-14: qty 120

## 2015-10-14 MED ORDER — DIPHENOXYLATE-ATROPINE 2.5-0.025 MG PO TABS: 1.0000 | ORAL_TABLET | Freq: Four times a day (QID) | ORAL | Status: AC | PRN

## 2015-10-14 MED ORDER — SODIUM CHLORIDE 0.9 % IV SOLN
Freq: Once | INTRAVENOUS | Status: AC
  Administered 2015-10-14: 13:00:00 via INTRAVENOUS

## 2015-10-14 MED ORDER — OXYCODONE HCL 15 MG PO TABS: 15.0000 mg | ORAL_TABLET | ORAL | Status: AC | PRN

## 2015-10-14 MED ORDER — SODIUM CHLORIDE 0.9% FLUSH: 10.0000 mL | INTRAVENOUS | Status: DC | PRN

## 2015-10-14 MED ORDER — MITOMYCIN CHEMO IV INJECTION 20 MG
10.0000 mg/m2 | Freq: Once | INTRAVENOUS | Status: AC
  Administered 2015-10-14: 17.5 mg via INTRAVENOUS
  Filled 2015-10-14: qty 35

## 2015-10-14 MED ORDER — SODIUM CHLORIDE 0.9 % IV SOLN
Freq: Once | INTRAVENOUS | Status: AC
  Administered 2015-10-14: 13:00:00 via INTRAVENOUS
  Filled 2015-10-14: qty 4

## 2015-10-14 NOTE — Progress Notes (Signed)
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   08/15/2015 Pathology Results Invasive squamous cell carcinoma with involvement of multiple margins   08/21/2015 Imaging CT CAP- mild diffuse distal rectal wall thickening possibly representing recently diagnosed malignancy, borderline L pelvic and bilateral inguinal adenopathy   08/27/2015 Procedure Colonoscopy with Dr. Oneida Alar   09/02/2015 PET scan intense malignant range FDG uptake associated with the anal mass, enlarged and hypermetabolic L inguinal LN, cannot rule out metastatic adenopathy. nodule in R lobe of thyroid 2.5 cm no significant FDG uptake   09/16/2015 -  Radiation Therapy Dr. Lisbeth Renshaw, planned to begin on 09/16/2015   09/16/2015 -  Chemotherapy Mitomycin/5-FU    HISTORY OF PRESENTING ILLNESS:  Jillian Estrada 60 y.o. female is here for follow-up of squamous cell carcinoma of the anus.   Mrs. Tess is here with her husband. She is here for her final cycle of chemotherapy. She notes frequent small bowel movements. She believes this is from XRT as it has just begun over the past 4 to 5 days. She notes she has the urge to go and then only goes a small amount. She has taken immodium with little relief.   She is eating well. She has contacted her psychiatrist. She thinks she is going better. She notes she is ready to be done with therapy.  Denies nausea, vomiting, mouth sores, cough or fever.  Dysuria has resolved. She has taken her cipro.    MEDICAL HISTORY:  Past Medical History  Diagnosis Date  . Depression     ?Bipolar, sees psychiatrist, Sees Dr.  Riley Churches in [place]. Hospitalized a few times, Feb 2013 and Aug 2015.   Marland Kitchen Hypertension   . Anal cancer ([place])   . Kidney failure     Lithium overdose unintentional, now resolved  . Hepatitis C     treated through [facility]  . 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 Anal cancer ([place])   . Cancer [facility])     Anal    SURGICAL HISTORY: Past Surgical History  Procedure Laterality Date  . Back surgery      Nov 2008, Aug 2011  . Small bowel repair      June 2010. Injury to small bowel during ovarian cyst surgery  . Foreign object removal      Aug 2010, surgical tool left during ovarian surgery  . Colonoscopy  2010    [place]: non-bleeding internal hemorrhoids   . Rectal biopsy    . Colonoscopy with propofol N/A 08/27/2015    Procedure: COLONOSCOPY WITH PROPOFOL;  Surgeon: Danie Binder, MD;  Location: [facility] ENDO SUITE;  Service: Endoscopy;  Laterality: N/A;  915  . Portacath placement Left 08/30/2015    Procedure: INSERTION PORT-A-CATH LEFT SUBCLAVIAN;  Surgeon: Aviva Signs, MD;  Location: [facility] ORS;  Service: General;  Laterality: Left;    SOCIAL HISTORY: Social History   Social History  . Marital Status: Married    Spouse Name: N/A  . Number of Children: N/A  . Years of Education: N/A   Occupational History  . retired     worked 74 years for  [employer]   Social History Main Topics  . Smoking status: Never Smoker   . Smokeless tobacco: Not on file  . Alcohol Use: 0.0 oz/week    0 Standard drinks or equivalent per week     Comment: occasionally  . Drug Use: No  . Sexual Activity: Not on file   Other Topics Concern  . Not on file   Social History Narrative   Married; husband Iona Beard. 87 years married. He has two children; she has step children. 1 step grand-child. Born in [place], [place]. Lived in [place]. Now live in [place], [place] She says she has 5 dogs; 4 pomeranians and a chihuahua.  Other than this, she says she has "no hobbies." Her  husband builds show cars, which she assists with. She notes that she doesn't garden. Non-smoker, quit in 2001. Her husband quit two years ago. She drinks occasionally, noting "a beer."  FAMILY HISTORY: Family History  Problem Relation Age of Onset  . Colon polyps Father   . Colon cancer Neg Hx    Father is deceased, 11-02-12. He had heart attacks and a brain bleed from a stroke. Mother 56, lives in [place]; "never wants me to come see her." Says her mother says she can't muster up the strength to have visitors. Had a sister, who was killed in an automobile accident. 1 adopted brother as well.  ALLERGIES:  is allergic to penicillins and codeine.  MEDICATIONS:  Current Outpatient Prescriptions  Medication Sig Dispense Refill  . allopurinol (ZYLOPRIM) 100 MG tablet Take 100 mg by mouth daily.    . ARIPiprazole (ABILIFY) 15 MG tablet Take 15 mg by mouth daily.    Marland Kitchen aspirin EC 81 MG tablet Take 1 tablet (81 mg total) by mouth daily. For prevention of stroke and heart attack 1 tablet 0  . cetirizine (ZYRTEC) 10 MG tablet Take 10 mg by mouth daily.    . Cholecalciferol (VITAMIN D) 2000 UNITS CAPS Take 1 capsule (2,000 Units total) by mouth every morning. For Vitamin D replacement and mood control. 30 capsule 0  . ciprofloxacin (CIPRO) 500 MG tablet Take 1 tablet (500 mg total) by mouth 2 (two) times daily. 10 tablet 0  . clonazePAM (KLONOPIN) 0.5 MG tablet Take 0.5 mg by mouth at bedtime.     . cyclobenzaprine (FLEXERIL) 10 MG tablet Take 1 tablet (10 mg total) by mouth 3 (three) times daily as needed. For pain 30 tablet 0  . fluorouracil CALGB 60454 in sodium chloride 0.9 % 150 mL Inject into the vein over 96 hr. To infuse Days 1-5 and Days 28-32    . lidocaine-prilocaine (EMLA) cream Apply a quarter size amount to port site 1 hour prior to chemo. Do not rub in. Cover with plastic wrap. 30 g 3  . magic mouthwash SOLN Swish and swallow 66ml four times a day for mouth pain. 360 mL 1    . metoprolol tartrate (LOPRESSOR) 25 MG tablet Take 25 mg by mouth 2 (two) times daily.    Marland Kitchen MITOMYCIN IV Inject into the vein. Day 1 and Day 28    . oxyCODONE (OXYCONTIN) 30 MG 12 hr tablet Take 30 mg by mouth 2 (two) times daily. 60 each 0  . oxyCODONE (ROXICODONE) 15 MG immediate release tablet Take 1 tablet (15 mg total) by mouth every 4 (four) hours as needed for pain. 90 tablet 0  . prochlorperazine (COMPAZINE) 10 MG tablet Take 1 tablet (10 mg total) by mouth  every 6 (six) hours as needed for nausea or vomiting. 30 tablet 0  . sertraline (ZOLOFT) 100 MG tablet Take 150 mg by mouth daily.    Marland Kitchen tolterodine (DETROL) 2 MG tablet Take 2 mg by mouth daily as needed (bladder). Reported on 08/27/2015    . venlafaxine XR (EFFEXOR-XR) 150 MG 24 hr capsule Take 150 mg by mouth daily with breakfast.    . alendronate (FOSAMAX) 70 MG tablet Take 70 mg by mouth once a week. Reported on 10/14/2015    . Biotin 5000 MCG CAPS Take 1 capsule by mouth 3 (three) times daily. Reported on 10/14/2015    . diphenoxylate-atropine (LOMOTIL) 2.5-0.025 MG tablet Take 1 tablet by mouth 4 (four) times daily as needed for diarrhea or loose stools. 30 tablet 0  . docusate sodium (COLACE) 100 MG capsule Take 200 mg by mouth daily. Reported on 10/14/2015    . ondansetron (ZOFRAN) 8 MG tablet Take 1 tablet (8 mg total) by mouth every 8 (eight) hours as needed for nausea or vomiting. (Patient not taking: Reported on 10/14/2015) 30 tablet 2   No current facility-administered medications for this visit.   Facility-Administered Medications Ordered in Other Visits  Medication Dose Route Frequency Provider Last Rate Last Dose  . fluorouracil (ADRUCIL) 6,000 mg in sodium chloride 0.9 % 130 mL chemo infusion  850 mg/m2/day (Treatment Plan Actual) Intravenous 4 days Patrici Ranks, MD      . mitoMYcin [facility]) chemo injection 17.5 mg  10 mg/m2 (Treatment Plan Actual) Intravenous Once Patrici Ranks, MD      . sodium chloride flush  (NS) 0.9 % injection 10 mL  10 mL Intracatheter PRN Patrici Ranks, MD        Review of Systems  Constitutional: Negative. HENT: Negative for mouth pain Eyes: Negative.   Respiratory: Negative.   Cardiovascular: Negative.   Gastrointestinal: Positive for rectal pain, diarrhea Genitourinary: Negative  Musculoskeletal: Positive for back pain.       Chronic bad back.  Skin: Negative.   Neurological: Positive for depression. Endo/Heme/Allergies: Negative.   Psychiatric/Behavioral: Negative.   All other systems reviewed and are negative. 14 point ROS was done and is otherwise as detailed above or in HPI   PHYSICAL EXAMINATION: ECOG PERFORMANCE STATUS: 1 - Symptomatic but completely ambulatory  Filed Vitals:   10/14/15 1046  BP: 128/76  Pulse: 78  Temp: 98.9 F (37.2 C)  Resp: 16   Filed Weights   10/14/15 1046  Weight: 141 lb 4.8 oz (64.093 kg)    Physical Exam  Constitutional: She is oriented to person, place, and time and well-developed, well-nourished HENT:  Head: Normocephalic and atraumatic.  Nose: Nose normal.  Mouth/Throat: Oropharynx is clear and moist. No oropharyngeal exudate.  Eyes: Conjunctivae and EOM are normal. Pupils are equal, round, and reactive to light. Right eye exhibits no discharge. Left eye exhibits no discharge. No scleral icterus.  Neck: Normal range of motion. Neck supple. No tracheal deviation present. No thyromegaly present.  Cardiovascular: Normal rate, regular rhythm and normal heart sounds.  Exam reveals no gallop and no friction rub.   No murmur heard. Port a cath C/D/I Pulmonary/Chest: Effort normal and breath sounds normal. She has no wheezes. She has no rales.  Abdominal: Soft. Bowel sounds are normal. She exhibits no distension and no mass. There is no tenderness. There is no rebound and no guarding.  Musculoskeletal: Normal range of motion. She exhibits no edema.  Lymphadenopathy:    She has  no cervical adenopathy. L inquinal  adenopathy no longer palpable Neurological: She is alert and oriented to person, place, and time. She has normal reflexes. No cranial nerve deficit. Gait normal. Coordination normal.  Skin: Skin is warm and dry. No rash noted.  Psychiatric: Mood, memory, affect and judgment normal.  Nursing note and vitals reviewed.   LABORATORY DATA:  I have reviewed the data as listed Lab Results  Component Value Date   WBC 5.4 10/14/2015   HGB 12.0 10/14/2015   HCT 34.8* 10/14/2015   MCV 92.3 10/14/2015   PLT 239 10/14/2015   CMP     Component Value Date/Time   NA 137 10/14/2015 1129   K 3.9 10/14/2015 1129   CL 104 10/14/2015 1129   CO2 26 10/14/2015 1129   GLUCOSE 134* 10/14/2015 1129   BUN 14 10/14/2015 1129   CREATININE 0.64 10/14/2015 1129   CALCIUM 9.1 10/14/2015 1129   PROT 7.3 10/14/2015 1129   ALBUMIN 3.9 10/14/2015 1129   AST 23 10/14/2015 1129   ALT 19 10/14/2015 1129   ALKPHOS 52 10/14/2015 1129   BILITOT 0.3 10/14/2015 1129   GFRNONAA >60 10/14/2015 1129   GFRAA >60 10/14/2015 1129     RADIOGRAPHIC STUDIES: I have personally reviewed the radiological images as listed and agreed with the findings in the report.  No results found.    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 Oneida Alar 08/28/2015 Port a cath placement Hypotension Mild mucositis secondary 5-FU Depression Dysuria  Overall she has and is doing remarkably well. Side effects have been few. She is easily discouraged. Has a history of severe depression. She is routinely followed by a psychiatrist and counselor and has spoken with her psychiatrist.  I have dose reduced her 5-FU by 15%. I have written her for lomotil. She is having small frequent BM's which is at this point new and most likely XRT induced. We will follow closely.   I have refilled the patient's  medications,short acting oxycodone. I have written for lomotil.   She will return for follow-up in 1 week.   All questions were answered. The patient knows to call the clinic with any problems, questions or concerns.  This document serves as a record of services personally performed by Ancil Linsey, MD. It was created on her behalf by Arlyce Harman, a trained medical scribe. The creation of this record is based on the scribe's personal observations and the provider's statements to them. This document has been checked and approved by the attending provider.  I have reviewed the above documentation for accuracy and completeness and I agree with the above.  This note was electronically signed.    Molli Hazard, MD  10/14/2015 1:03 PM

## 2015-10-14 NOTE — Patient Instructions (Signed)
[facility] Discharge Instructions for Patients Receiving Chemotherapy   Beginning January 23rd 2017 lab work for the [facility] will be done in the  Main lab at [facility] on 1st floor. If you have a lab appointment with the [facility] please come in thru the  Main Entrance and check in at the main information desk   Today you received the following chemotherapy agents: mitomycin and fluorouracil.     If you develop nausea and vomiting, or diarrhea that is not controlled by your medication, call the clinic.  The clinic phone number is [phone]. Office hours are Monday-Friday 8:30am-5:00pm.  BELOW ARE SYMPTOMS THAT SHOULD BE REPORTED IMMEDIATELY:  *FEVER GREATER THAN 101.0 F  *CHILLS WITH OR WITHOUT FEVER  NAUSEA AND VOMITING THAT IS NOT CONTROLLED WITH YOUR NAUSEA MEDICATION  *UNUSUAL SHORTNESS OF BREATH  *UNUSUAL BRUISING OR BLEEDING  TENDERNESS IN MOUTH AND THROAT WITH OR WITHOUT PRESENCE OF ULCERS  *URINARY PROBLEMS  *BOWEL PROBLEMS  UNUSUAL RASH Items with * indicate a potential emergency and should be followed up as soon as possible. If you have an emergency after office hours please contact your primary care physician or go to the nearest emergency department.  Please call the clinic during office hours if you have any questions or concerns.   You may also contact the Patient Navigator at [phone] should you have any questions or need assistance in obtaining follow up care.      Resources For Cancer Patients and their Caregivers ? American Cancer Society: Can assist with transportation, wigs, general needs, runs Look Good Feel Better.        4175296753 ? Cancer Care: Provides financial assistance, online support groups, medication/co-pay assistance.  1-800-813-HOPE 619-879-5554) ? [place] Assists [place] cancer patients and their families through emotional , educational and financial support.   409-644-0796 ? [place] DSS Where to apply for food stamps, Medicaid and utility assistance. 320-830-9894 ? RCATS: Transportation to medical appointments. (817)485-8346 ? Social Security Administration: May apply for disability if have a Stage IV cancer. 6701569648 343-297-0966 ? [employer], Disability and Transit Services: Assists with nutrition, care and transit needs. [phone]

## 2015-10-14 NOTE — Progress Notes (Signed)
Patient tolerated infusion well.  VSS.   

## 2015-10-14 NOTE — Patient Instructions (Signed)
[place] at [facility] Discharge Instructions  RECOMMENDATIONS MADE BY THE CONSULTANT AND ANY TEST RESULTS WILL BE SENT TO YOUR REFERRING PHYSICIAN.  You were seen by Dr. Whitney Muse today. Follow-up with the clinic on Monday. You will have labs on Monday as well. Call clinic with any questions or concerns.   Thank you for choosing [place] at [facility] to provide your oncology and hematology care.  To afford each patient quality time with our provider, please arrive at least 15 minutes before your scheduled appointment time.   Beginning January 23rd 2017 lab work for the [facility] will be done in the  Main lab at [facility] on 1st floor. If you have a lab appointment with the [facility] please come in thru the  Main Entrance and check in at the main information desk  You need to re-schedule your appointment should you arrive 10 or more minutes late.  We strive to give you quality time with our providers, and arriving late affects you and other patients whose appointments are after yours.  Also, if you no show three or more times for appointments you may be dismissed from the clinic at the providers discretion.     Again, thank you for choosing [facility].  Our hope is that these requests will decrease the amount of time that you wait before being seen by our physicians.       _____________________________________________________________  Should you have questions after your visit to [facility], please contact our office at [phone] between the hours of 8:30 a.m. and 4:30 p.m.  Voicemails left after 4:30 p.m. will not be returned until the following business day.  For prescription refill requests, have your pharmacy contact our office.         Resources For Cancer Patients and their Caregivers ? American Cancer Society: Can assist with transportation, wigs, general needs, runs Look Good Feel Better.         319-884-5221 ? Cancer Care: Provides financial assistance, online support groups, medication/co-pay assistance.  1-800-813-HOPE 440-461-0501) ? [place] Assists [place] cancer patients and their families through emotional , educational and financial support.  (938) 650-2008 ? [place] DSS Where to apply for food stamps, Medicaid and utility assistance. 816-067-7377 ? RCATS: Transportation to medical appointments. 509-815-2430 ? Social Security Administration: May apply for disability if have a Stage IV cancer. 424-707-8525 818-452-2628 ? [employer], Disability and Transit Services: Assists with nutrition, care and transit needs. [place] Support Programs: @10RELATIVEDAYS @ > Cancer Support Group  2nd Tuesday of the month 1pm-2pm, Journey Room  > Creative Journey  3rd Tuesday of the month 1130am-1pm, Journey Room  > Look Good Feel Better  1st Wednesday of the month 10am-12 noon, Journey Room (Call [place] to register [phone])

## 2015-10-18 ENCOUNTER — Encounter (HOSPITAL_COMMUNITY): Payer: Self-pay

## 2015-10-18 ENCOUNTER — Encounter (HOSPITAL_BASED_OUTPATIENT_CLINIC_OR_DEPARTMENT_OTHER): Payer: Medicare Other

## 2015-10-18 VITALS — BP 118/73 | HR 95 | Temp 98.5°F | Resp 18

## 2015-10-18 DIAGNOSIS — C210 Malignant neoplasm of anus, unspecified: Secondary | ICD-10-CM

## 2015-10-18 MED ORDER — SODIUM CHLORIDE 0.9% FLUSH
10.0000 mL | INTRAVENOUS | Status: DC | PRN
  Administered 2015-10-18: 10 mL
  Filled 2015-10-18: qty 10

## 2015-10-18 MED ORDER — HEPARIN SOD (PORK) LOCK FLUSH 100 UNIT/ML IV SOLN
500.0000 [IU] | Freq: Once | INTRAVENOUS | Status: AC | PRN
  Administered 2015-10-18: 500 [IU]

## 2015-10-18 NOTE — Progress Notes (Signed)
1315:  Jillian Estrada presents to have home infusion pump d/c'd and for port-a-cath deaccess with flush. Portacath located left chest wall accessed with  H 20 needle.  Good blood return present. Portacath flushed with NS and 500U/80ml Heparin, and needle removed intact.  Procedure tolerated well and without incident.

## 2015-10-18 NOTE — Patient Instructions (Signed)
[place] at [facility] Discharge Instructions  RECOMMENDATIONS MADE BY THE CONSULTANT AND ANY TEST RESULTS WILL BE SENT TO YOUR REFERRING PHYSICIAN.  Pump removal and port flush today. Return as scheduled for office visit.  Thank you for choosing [place] at [facility] to provide your oncology and hematology care.  To afford each patient quality time with our provider, please arrive at least 15 minutes before your scheduled appointment time.   Beginning January 23rd 2017 lab work for the [facility] will be done in the  Main lab at [facility] on 1st floor. If you have a lab appointment with the [facility] please come in thru the  Main Entrance and check in at the main information desk  You need to re-schedule your appointment should you arrive 10 or more minutes late.  We strive to give you quality time with our providers, and arriving late affects you and other patients whose appointments are after yours.  Also, if you no show three or more times for appointments you may be dismissed from the clinic at the providers discretion.     Again, thank you for choosing [facility].  Our hope is that these requests will decrease the amount of time that you wait before being seen by our physicians.       _____________________________________________________________  Should you have questions after your visit to [facility], please contact our office at [phone] between the hours of 8:30 a.m. and 4:30 p.m.  Voicemails left after 4:30 p.m. will not be returned until the following business day.  For prescription refill requests, have your pharmacy contact our office.         Resources For Cancer Patients and their Caregivers ? American Cancer Society: Can assist with transportation, wigs, general needs, runs Look Good Feel Better.        ([phone] ? Cancer Care: Provides financial assistance, online  support groups, medication/co-pay assistance.  1-800-813-HOPE 901-330-7393) ? [place] Assists [place] cancer patients and their families through emotional , educational and financial support.  571-514-1974 ? [place] DSS Where to apply for food stamps, Medicaid and utility assistance. (226)389-6054 ? RCATS: Transportation to medical appointments. 2791235656 ? Social Security Administration: May apply for disability if have a Stage IV cancer. 210-739-7069 7244750023 ? [employer], Disability and Transit Services: Assists with nutrition, care and transit needs. [place] Support Programs: @10RELATIVEDAYS @ > Cancer Support Group  2nd Tuesday of the month 1pm-2pm, Journey Room  > Creative Journey  3rd Tuesday of the month 1130am-1pm, Journey Room  > Look Good Feel Better  1st Wednesday of the month 10am-12 noon, Journey Room (Call [place] to register [phone])

## 2015-10-21 ENCOUNTER — Encounter (HOSPITAL_BASED_OUTPATIENT_CLINIC_OR_DEPARTMENT_OTHER): Payer: Medicare Other | Admitting: Oncology

## 2015-10-21 ENCOUNTER — Encounter (HOSPITAL_COMMUNITY): Payer: Self-pay | Admitting: Oncology

## 2015-10-21 ENCOUNTER — Encounter (HOSPITAL_COMMUNITY): Payer: Medicare Other

## 2015-10-21 VITALS — BP 132/89 | Temp 98.6°F | Resp 20 | Wt 138.0 lb

## 2015-10-21 DIAGNOSIS — C210 Malignant neoplasm of anus, unspecified: Secondary | ICD-10-CM

## 2015-10-21 LAB — CBC WITH DIFFERENTIAL/PLATELET
BASOS ABS: 0 10*3/uL (ref 0.0–0.1)
Basophils Relative: 0 %
EOS ABS: 0 10*3/uL (ref 0.0–0.7)
EOS PCT: 3 %
HCT: 30.8 % — ABNORMAL LOW (ref 36.0–46.0)
Hemoglobin: 10.7 g/dL — ABNORMAL LOW (ref 12.0–15.0)
Lymphocytes Relative: 5 %
Lymphs Abs: 0.1 10*3/uL — ABNORMAL LOW (ref 0.7–4.0)
MCH: 31.8 pg (ref 26.0–34.0)
MCHC: 34.7 g/dL (ref 30.0–36.0)
MCV: 91.4 fL (ref 78.0–100.0)
Monocytes Absolute: 0.1 10*3/uL (ref 0.1–1.0)
Monocytes Relative: 5 %
Neutro Abs: 1.3 10*3/uL — ABNORMAL LOW (ref 1.7–7.7)
Neutrophils Relative %: 87 %
PLATELETS: 166 10*3/uL (ref 150–400)
RBC: 3.37 MIL/uL — AB (ref 3.87–5.11)
RDW: 14.5 % (ref 11.5–15.5)
WBC: 1.5 10*3/uL — AB (ref 4.0–10.5)

## 2015-10-21 LAB — COMPREHENSIVE METABOLIC PANEL
ALT: 13 U/L — AB (ref 14–54)
AST: 19 U/L (ref 15–41)
Albumin: 3.6 g/dL (ref 3.5–5.0)
Alkaline Phosphatase: 45 U/L (ref 38–126)
Anion gap: 6 (ref 5–15)
BUN: 15 mg/dL (ref 6–20)
CHLORIDE: 102 mmol/L (ref 101–111)
CO2: 28 mmol/L (ref 22–32)
CREATININE: 0.73 mg/dL (ref 0.44–1.00)
Calcium: 9.1 mg/dL (ref 8.9–10.3)
GFR calc Af Amer: >60 mL/min (ref >60)
GFR calc non Af Amer: >60 mL/min (ref >60)
Glucose, Bld: 172 mg/dL — ABNORMAL HIGH (ref 65–99)
POTASSIUM: 4.3 mmol/L (ref 3.5–5.1)
SODIUM: 136 mmol/L (ref 135–145)
TOTAL PROTEIN: 7.1 g/dL (ref 6.5–8.1)
Total Bilirubin: 0.7 mg/dL (ref 0.3–1.2)

## 2015-10-21 NOTE — Patient Instructions (Addendum)
[place] at [facility] Discharge Instructions  RECOMMENDATIONS MADE BY THE CONSULTANT AND ANY TEST RESULTS WILL BE SENT TO YOUR REFERRING PHYSICIAN.  Bring a urine sample in with you next time you come   Go by [employer] and purchase some Aloe Vera (pure) for your rectal area.  Return next week Monday June 19 @ 2:30   Thank you for choosing [place] at [facility] to provide your oncology and hematology care.  To afford each patient quality time with our provider, please arrive at least 15 minutes before your scheduled appointment time.   Beginning January 23rd 2017 lab work for the [facility] will be done in the  Main lab at [facility] on 1st floor. If you have a lab appointment with the [facility] please come in thru the  Main Entrance and check in at the main information desk  You need to re-schedule your appointment should you arrive 10 or more minutes late.  We strive to give you quality time with our providers, and arriving late affects you and other patients whose appointments are after yours.  Also, if you no show three or more times for appointments you may be dismissed from the clinic at the providers discretion.     Again, thank you for choosing [facility].  Our hope is that these requests will decrease the amount of time that you wait before being seen by our physicians.       _____________________________________________________________  Should you have questions after your visit to [facility], please contact our office at [phone] between the hours of 8:30 a.m. and 4:30 p.m.  Voicemails left after 4:30 p.m. will not be returned until the following business day.  For prescription refill requests, have your pharmacy contact our office.         Resources For Cancer Patients and their Caregivers ? American Cancer Society: Can assist with transportation, wigs, general needs,  runs Look Good Feel Better.        585-464-9642 ? Cancer Care: Provides financial assistance, online support groups, medication/co-pay assistance.  1-800-813-HOPE 262 736 3745) ? [place] Assists [place] cancer patients and their families through emotional , educational and financial support.  308-741-6669 ? [place] DSS Where to apply for food stamps, Medicaid and utility assistance. 7638093338 ? RCATS: Transportation to medical appointments. (319) 534-3432 ? Social Security Administration: May apply for disability if have a Stage IV cancer. ([phone] 937-115-9670 ? [employer], Disability and Transit Services: Assists with nutrition, care and transit needs. [place] Support Programs: @10RELATIVEDAYS @ > Cancer Support Group  2nd Tuesday of the month 1pm-2pm, Journey Room  > Creative Journey  3rd Tuesday of the month 1130am-1pm, Journey Room  > Look Good Feel Better  1st Wednesday of the month 10am-12 noon, Journey Room (Call [place] to register [phone])

## 2015-10-21 NOTE — Progress Notes (Signed)
Tobe Sos, MD [address], [address] [place] [address]  Primary anal squamous cell carcinoma (HCC)  CURRENT THERAPY: Supportive care, surveillance per NCCN guidelines  INTERVAL HISTORY: Jillian Estrada 61 y.o. 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 S/P concomitant chemoradiation with 5FU/Mitomycin-C (09/16/2015- 10/18/2015).     Primary anal squamous cell carcinoma ([place])   08/15/2015 Procedure Excision and multiple biopsiies of "anal mass."  Performed at [facility].   08/15/2015 Pathology Results Invasive squamous cell carcinoma with involvement of multiple margins   08/21/2015 Imaging CT CAP- mild diffuse distal rectal wall thickening possibly representing recently diagnosed malignancy, borderline L pelvic and bilateral inguinal adenopathy   08/27/2015 Procedure Colonoscopy with Dr. Oneida Alar   09/02/2015 PET scan intense malignant range FDG uptake associated with the anal mass, enlarged and hypermetabolic L inguinal LN, cannot rule out metastatic adenopathy. nodule in R lobe of thyroid 2.5 cm no significant FDG uptake   09/16/2015 -  Radiation Therapy Dr. Lisbeth Renshaw, planned to begin on 09/16/2015   09/16/2015 - 10/18/2015 Chemotherapy Mitomycin/5-FU   10/14/2015 Treatment Plan Change 5Fu dose reduced by 15%.   She is discouraged regarding her upcoming treatment. "I can't do this anymore."  She has 6 more radiation treatments. She'll be finished on 10/22/2015. She reports extreme tenderness in the perineal and perianal region. She notes that sitting is comfortable but movement while sitting is uncomfortable. She is tearful today during discussion.  She's provide significant education regarding curative intent anal cancer treatment. She notes that her desquamation and pain is typical in  this situation; particularly near the end of treatment.  She is following directions from radiation therapy regarding skin care. She is utilizing green-colored aloe vera gel. She is advised to stop taking this. I will call [facility] and they do carry the clear aloe vera. She will pick that up today.  She continues with loose stools and utilizing antidiarrheals as directed.  Other than her perianal pain, she is doing well.   Review of Systems  Constitutional: Negative.  Negative for fever and chills.  HENT: Negative.   Eyes: Negative.   Cardiovascular: Negative.   Gastrointestinal: Negative.  Negative for nausea, vomiting, diarrhea (loose stools) and constipation.       Painful BM.   Genitourinary: Positive for dysuria. Negative for urgency and frequency.  Musculoskeletal: Negative.   Skin:       Peri-anal, perineal, and vaginal pain with redness and soreness  Neurological: Negative.   Endo/Heme/Allergies: Negative.   Psychiatric/Behavioral: Negative.      Past Medical History  Diagnosis Date  . Depression     ?Bipolar, sees psychiatrist, Sees Dr. Riley Churches in [place]. Hospitalized a few times, Feb 2013 and Aug 2015.   Marland Kitchen Hypertension   . Anal cancer ([place])   . Kidney failure     Lithium overdose unintentional, now resolved  . Hepatitis C     treated through [facility]  . Primary anal squamous cell carcinoma ([place]) 08/21/2015  . Anal cancer ([place])   . Cancer [facility])     Anal    Past Surgical History  Procedure Laterality Date  . Back surgery      Nov 2008, Aug 2011  . Small bowel repair      June 2010. Injury to  small bowel during ovarian cyst surgery  . Foreign object removal      Aug 2010, surgical tool left during ovarian surgery  . Colonoscopy  2010    [place]: non-bleeding internal hemorrhoids   . Rectal biopsy    . Colonoscopy with propofol N/A 08/27/2015    Procedure: COLONOSCOPY WITH PROPOFOL;  Surgeon: Danie Binder, MD;  Location: [facility] ENDO SUITE;   Service: Endoscopy;  Laterality: N/A;  915  . Portacath placement Left 08/30/2015    Procedure: INSERTION PORT-A-CATH LEFT SUBCLAVIAN;  Surgeon: Aviva Signs, MD;  Location: [facility] ORS;  Service: General;  Laterality: Left;    Family History  Problem Relation Age of Onset  . Colon polyps Father   . Colon cancer Neg Hx     Social History   Social History  . Marital Status: Married    Spouse Name: N/A  . Number of Children: N/A  . Years of Education: N/A   Occupational History  . retired     worked 16 years for [employer]  . Smoking status: Never Smoker   . Smokeless tobacco: None  . Alcohol Use: 0.0 oz/week    0 Standard drinks or equivalent per week     Comment: occasionally  . Drug Use: No  . Sexual Activity: Not Asked   Other Topics Concern  . None   Social History Narrative     PHYSICAL EXAMINATION  ECOG PERFORMANCE STATUS: 1 - Symptomatic but completely ambulatory  Filed Vitals:   10/21/15 0913  BP: 132/89  Temp: 98.6 F (37 C)  Resp: 20    GENERAL:alert, no distress, cooperative and crying, accompanied by her husband, seen sitting comfortable in chair. SKIN: skin color, texture, turgor are normal, no rashes or significant lesions HEAD: Normocephalic, No masses, lesions, tenderness or abnormalities EYES: normal, Conjunctiva are pink and non-injected EARS: External ears normal OROPHARYNX:mucous membranes are moist  NECK: supple, trachea midline LYMPH:  not examined BREAST:not examined LUNGS: clear to auscultation  HEART: regular rate & rhythm ABDOMEN:abdomen soft BACK: Back symmetric, no curvature. EXTREMITIES:less then 2 second capillary refill, no skin discoloration, no cyanosis  NEURO: alert & oriented x 3 with fluent speech, gait normal PELVIC: external genitalia and vagina are erythematous RECTAL: positive findings:  Perianal, perineal, desquamation.  Beefy red.  Tender.  NO DRE today.   LABORATORY  DATA: CBC    Component Value Date/Time   WBC 1.5* 10/21/2015 0839   RBC 3.37* 10/21/2015 0839   HGB 10.7* 10/21/2015 0839   HCT 30.8* 10/21/2015 0839   PLT 166 10/21/2015 0839   MCV 91.4 10/21/2015 0839   MCH 31.8 10/21/2015 0839   MCHC 34.7 10/21/2015 0839   RDW 14.5 10/21/2015 0839   LYMPHSABS 0.1* 10/21/2015 0839   MONOABS 0.1 10/21/2015 0839   EOSABS 0.0 10/21/2015 0839   BASOSABS 0.0 10/21/2015 0839      Chemistry      Component Value Date/Time   NA 136 10/21/2015 0839   K 4.3 10/21/2015 0839   CL 102 10/21/2015 0839   CO2 28 10/21/2015 0839   BUN 15 10/21/2015 0839   CREATININE 0.73 10/21/2015 0839      Component Value Date/Time   CALCIUM 9.1 10/21/2015 0839   ALKPHOS 45 10/21/2015 0839   AST 19 10/21/2015 0839   ALT 13* 10/21/2015 0839   BILITOT 0.7 10/21/2015 0839        PENDING LABS:   RADIOGRAPHIC STUDIES:  No results  found.   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 S/P concomitant chemoradiation with 5FU/Mitomycin-C (09/16/2015- 10/18/2015).   Oncology history is updated.  Labs today: CBC diff, CMET.    "I can't do this anymore."  She has 6 more radiation treatments finishing on 11/02/2015. With a very long conversation about this and she is encouraged to move forward as planned. She is anticipating to complete therapy. She's provide education regarding radiation therapy associated desquamation. She is discouraged based upon the way she feels. She notes significant anorectal pain and perineal pain. She is seen during examination sitting comfortably and able to get on the exam table and maneuver. Exam is impressive for desquamation of the anal region and perineal region. She is utilizing aloe vera (the-colored version). I  called [facility] they have clear aloe vera. She's notable go by there today and take clear aloe vera.  She has radiation later this morning. She's leaving the clinic today and after going to [place], she'll be going to XRT today.  She'll try to ascertain the urine sample next week prior to her follow-up appointment. This will be done to evaluate for clearance of possible UTI.  She will return next week for follow-up. This time she needs supportive care.      ORDERS PLACED FOR THIS ENCOUNTER: No orders of the defined types were placed in this encounter.    MEDICATIONS PRESCRIBED THIS ENCOUNTER: No orders of the defined types were placed in this encounter.    THERAPY PLAN:  NCCN guidelines recommends the following surveillance for anal cancer (2.2017):  A. DRE every 3-6 months for 5 years  B. Anoscopy every 6-12 months x 3 years  C. Inguinal node palpation every 3-6 months for 5 years  D. T3-T4 or inguinal node positive disease, CT [facility] annually x 3 years.     All questions were answered. The patient knows to call the clinic with any problems, questions or concerns. We can certainly see the patient much sooner if necessary.  Patient and plan discussed with Dr. Ancil Linsey and she is in agreement with the aforementioned.   This note is electronically signed by: Doy Mince 10/21/2015 10:26 AM

## 2015-10-21 NOTE — Assessment & Plan Note (Addendum)
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 S/P concomitant chemoradiation with 5FU/Mitomycin-C (09/16/2015- 10/18/2015).   Oncology history is updated.  Labs today: CBC diff, CMET.    "I can't do this anymore."  She has 6 more radiation treatments finishing on 11/06/2015. With a very long conversation about this and she is encouraged to move forward as planned. She is anticipating to complete therapy. She's provide education regarding radiation therapy associated desquamation. She is discouraged based upon the way she feels. She notes significant anorectal pain and perineal pain. She is seen during examination sitting comfortably and able to get on the exam table and maneuver. Exam is impressive for desquamation of the anal region and perineal region. She is utilizing aloe vera (the-colored version). I called [facility] they have clear aloe vera. She's notable go by there today and take clear aloe vera.  She has radiation later this morning. She's leaving the clinic today and after going to [place], she'll be going to XRT today.  She'll try to ascertain the urine sample next week prior to her follow-up appointment. This will be done to evaluate for clearance of possible UTI.  She will return next week for follow-up. This time she needs supportive care.

## 2015-10-24 ENCOUNTER — Encounter (HOSPITAL_COMMUNITY): Payer: Self-pay

## 2015-10-24 ENCOUNTER — Telehealth (HOSPITAL_COMMUNITY): Payer: Self-pay | Admitting: *Deleted

## 2015-10-24 ENCOUNTER — Emergency Department (HOSPITAL_COMMUNITY)
Admission: EM | Admit: 2015-10-24 | Discharge: 2015-10-24 | Disposition: A | Discharge status: Home / Self Care | Payer: Medicare Other | Attending: Emergency Medicine | Admitting: Emergency Medicine

## 2015-10-24 DIAGNOSIS — Z7982 Long term (current) use of aspirin: Secondary | ICD-10-CM | POA: Diagnosis not present

## 2015-10-24 DIAGNOSIS — Z85048 Personal history of other malignant neoplasm of rectum, rectosigmoid junction, and anus: Secondary | ICD-10-CM | POA: Insufficient documentation

## 2015-10-24 DIAGNOSIS — R21 Rash and other nonspecific skin eruption: Secondary | ICD-10-CM | POA: Diagnosis present

## 2015-10-24 DIAGNOSIS — K520 Gastroenteritis and colitis due to radiation: Secondary | ICD-10-CM | POA: Insufficient documentation

## 2015-10-24 DIAGNOSIS — L589 Radiodermatitis, unspecified: Secondary | ICD-10-CM

## 2015-10-24 DIAGNOSIS — I10 Essential (primary) hypertension: Secondary | ICD-10-CM | POA: Diagnosis not present

## 2015-10-24 DIAGNOSIS — F329 Major depressive disorder, single episode, unspecified: Secondary | ICD-10-CM | POA: Insufficient documentation

## 2015-10-24 DIAGNOSIS — L598 Other specified disorders of the skin and subcutaneous tissue related to radiation: Secondary | ICD-10-CM | POA: Insufficient documentation

## 2015-10-24 DIAGNOSIS — Z79899 Other long term (current) drug therapy: Secondary | ICD-10-CM | POA: Diagnosis not present

## 2015-10-24 MED ORDER — LORAZEPAM 2 MG/ML IJ SOLN
0.5000 mg | Freq: Once | INTRAMUSCULAR | Status: AC
  Administered 2015-10-24: 0.5 mg via INTRAVENOUS
  Filled 2015-10-24: qty 1

## 2015-10-24 MED ORDER — HYDROMORPHONE HCL 1 MG/ML IJ SOLN
1.0000 mg | Freq: Once | INTRAMUSCULAR | Status: AC
  Administered 2015-10-24: 1 mg via INTRAVENOUS
  Filled 2015-10-24: qty 1

## 2015-10-24 MED ORDER — ONDANSETRON HCL 4 MG/2ML IJ SOLN
4.0000 mg | Freq: Once | INTRAMUSCULAR | Status: AC
  Administered 2015-10-24: 4 mg via INTRAVENOUS
  Filled 2015-10-24: qty 2

## 2015-10-24 MED ORDER — MELOXICAM 15 MG PO TABS: 15.0000 mg | ORAL_TABLET | Freq: Every day | ORAL | Status: AC | PRN

## 2015-10-24 MED ORDER — PROMETHAZINE HCL 25 MG PO TABS
25.0000 mg | ORAL_TABLET | Freq: Four times a day (QID) | ORAL | Status: AC | PRN
Start: 2015-10-24 — End: ?

## 2015-10-24 MED ORDER — SODIUM CHLORIDE 0.9 % IV BOLUS (SEPSIS)
1000.0000 mL | Freq: Once | INTRAVENOUS | Status: AC
  Administered 2015-10-24: 1000 mL via INTRAVENOUS

## 2015-10-24 MED ORDER — KETOROLAC TROMETHAMINE 30 MG/ML IJ SOLN
15.0000 mg | Freq: Once | INTRAMUSCULAR | Status: AC
  Administered 2015-10-24: 15 mg via INTRAVENOUS
  Filled 2015-10-24: qty 1

## 2015-10-24 MED ORDER — DIAZEPAM 5 MG PO TABS: 5.0000 mg | ORAL_TABLET | Freq: Three times a day (TID) | ORAL | Status: AC | PRN

## 2015-10-24 MED ORDER — HYDROMORPHONE HCL 1 MG/ML IJ SOLN
0.5000 mg | Freq: Once | INTRAMUSCULAR | Status: AC
  Administered 2015-10-24: 0.5 mg via INTRAVENOUS
  Filled 2015-10-24: qty 1

## 2015-10-24 NOTE — ED Notes (Signed)
Pt reports is receiving radiation for anal cancer.  PT says her perineal area is raw.  Pt reports severe pain and diarrhea.  Last chemo was last Friday and that finished her chemo treatment.

## 2015-10-24 NOTE — ED Provider Notes (Signed)
CSN: [id number]     Arrival date & time 10/24/15  [id number] History  By signing my name below, I, Nicole Kindred, attest that this documentation has been prepared under the direction and in the presence of Virgel Manifold, MD.   Electronically Signed: Nicole Kindred, ED Scribe. 10/24/2015. 11:14 AM   Chief Complaint  Patient presents with  . Rash    The history is provided by the patient. No language interpreter was used.   HPI Comments: Jillian Estrada is a 60 y.o. female with PMHx of anal cancer who presents to the Emergency Department complaining of gradual onset, worsening, erythematous and raw skin to her perineum area, ongoing for several days. She reports associated diarrhea, abdominal pain, and loss of appetite. Pt is receiving radiation for anal cancer. Her last radiation treatment was yesterday. Pt finished chemotherapy treatment one week ago. No other associated symptoms noted. Pt has taken oxycontin with no relief to symptoms. No other worsening or alleviating factors noted. Pt denies nausea, fever, chills, or any other pertinent symptoms Pt's oncologist is Dr. Ancil Linsey.   Past Medical History  Diagnosis Date  . Depression     ?Bipolar, sees psychiatrist, Sees Dr. Riley Churches in [place]. Hospitalized a few times, Feb 2013 and Aug 2015.   Marland Kitchen Hypertension   . Anal cancer ([place])   . Kidney failure     Lithium overdose unintentional, now resolved  . Hepatitis C     treated through [facility]  . Primary anal squamous cell carcinoma ([place]) 08/21/2015  . Anal cancer ([place])   . Cancer [facility])     Anal   Past Surgical History  Procedure Laterality Date  . Back surgery      Nov 2008, Aug 2011  . Small bowel repair      June 2010. Injury to small bowel during ovarian cyst surgery  . Foreign object removal      Aug 2010, surgical tool left during ovarian surgery  . Colonoscopy  2010    [place]: non-bleeding internal hemorrhoids   . Rectal biopsy    . Colonoscopy with  propofol N/A 08/27/2015    Procedure: COLONOSCOPY WITH PROPOFOL;  Surgeon: Danie Binder, MD;  Location: [facility] ENDO SUITE;  Service: Endoscopy;  Laterality: N/A;  915  . Portacath placement Left 08/30/2015    Procedure: INSERTION PORT-A-CATH LEFT SUBCLAVIAN;  Surgeon: Aviva Signs, MD;  Location: [facility] ORS;  Service: General;  Laterality: Left;   Family History  Problem Relation Age of Onset  . Colon polyps Father   . Colon cancer Neg Hx    Social History  Substance Use Topics  . Smoking status: Never Smoker   . Smokeless tobacco: None  . Alcohol Use: 0.0 oz/week    0 Standard drinks or equivalent per week     Comment: occasionally   OB History    No data available     Review of Systems  Constitutional: Positive for appetite change. Negative for fever and chills.  Gastrointestinal: Positive for abdominal pain and diarrhea. Negative for nausea and vomiting.  Skin: Positive for color change and wound.  All other systems reviewed and are negative.    Allergies  Penicillins and Codeine  Home Medications   Prior to Admission medications   Medication Sig Start Date End Date Taking? Authorizing Provider  alendronate (FOSAMAX) 70 MG tablet Take 70 mg by mouth once a week. Reported on 10/21/2015   Yes Historical Provider, MD  allopurinol (ZYLOPRIM) 100 MG tablet Take  100 mg by mouth daily.   Yes Historical Provider, MD  ARIPiprazole (ABILIFY) 15 MG tablet Take 15 mg by mouth daily.   Yes Historical Provider, MD  aspirin EC 81 MG tablet Take 1 tablet (81 mg total) by mouth daily. For prevention of stroke and heart attack 07/07/11  Yes Darrol Jump, MD  cetirizine (ZYRTEC) 10 MG tablet Take 10 mg by mouth daily.   Yes Historical Provider, MD  Cholecalciferol (VITAMIN D) 2000 UNITS CAPS Take 1 capsule (2,000 Units total) by mouth every morning. For Vitamin D replacement and mood control. 07/07/11  Yes Darrol Jump, MD  clonazePAM (KLONOPIN) 0.5 MG tablet Take 0.5 mg by mouth at bedtime.     Yes Historical Provider, MD  cyclobenzaprine (FLEXERIL) 10 MG tablet Take 1 tablet (10 mg total) by mouth 3 (three) times daily as needed. For pain 07/07/11  Yes Darrol Jump, MD  diphenoxylate-atropine (LOMOTIL) 2.5-0.025 MG tablet Take 1 tablet by mouth 4 (four) times daily as needed for diarrhea or loose stools. 10/14/15  Yes Patrici Ranks, MD  docusate sodium (COLACE) 100 MG capsule Take 200 mg by mouth daily. Reported on 10/14/2015   Yes Historical Provider, MD  lidocaine-prilocaine (EMLA) cream Apply a quarter size amount to port site 1 hour prior to chemo. Do not rub in. Cover with plastic wrap. 09/03/15  Yes Patrici Ranks, MD  magic mouthwash SOLN Swish and swallow 65ml four times a day for mouth pain. 10/09/15  Yes Patrici Ranks, MD  metoprolol tartrate (LOPRESSOR) 25 MG tablet Take 25 mg by mouth 2 (two) times daily.   Yes Historical Provider, MD  oxyCODONE (OXYCONTIN) 30 MG 12 hr tablet Take 30 mg by mouth 2 (two) times daily. 10/09/15  Yes Patrici Ranks, MD  oxyCODONE (ROXICODONE) 15 MG immediate release tablet Take 1 tablet (15 mg total) by mouth every 4 (four) hours as needed for pain. 10/14/15  Yes Patrici Ranks, MD  sertraline (ZOLOFT) 100 MG tablet Take 150 mg by mouth daily.   Yes Historical Provider, MD  tolterodine (DETROL) 2 MG tablet Take 2 mg by mouth daily as needed (bladder). Reported on 08/27/2015   Yes Historical Provider, MD  venlafaxine XR (EFFEXOR-XR) 150 MG 24 hr capsule Take 150 mg by mouth daily with breakfast.   Yes Historical Provider, MD  ondansetron (ZOFRAN) 8 MG tablet Take 1 tablet (8 mg total) by mouth every 8 (eight) hours as needed for nausea or vomiting. Patient not taking: Reported on 10/14/2015 09/02/15   Patrici Ranks, MD   BP 115/98 mmHg  Pulse 107  Temp(Src) 98.1 F (36.7 C) (Oral)  Resp 20  Ht 5\' 4"  (1.626 m)  Wt 135 lb (61.236 kg)  BMI 23.16 kg/m2  SpO2 100% Physical Exam  Constitutional: She is oriented to person, place, and time.  She appears well-developed and well-nourished.  Pt looks extremely uncomfortable.  HENT:  Head: Normocephalic and atraumatic.  Eyes: EOM are normal.  Neck: Normal range of motion.  Cardiovascular: Normal rate, regular rhythm and normal heart sounds.   Pulmonary/Chest: Effort normal and breath sounds normal.  Abdominal: Soft. She exhibits no distension. There is no tenderness.  Genitourinary:  Perineum, medial thighs, labia, buttocks erythematous with some areas of skin thickening and mild desquamation.   Musculoskeletal: Normal range of motion.  Neurological: She is alert and oriented to person, place, and time.  Skin: Skin is warm and dry. There is erythema.  Psychiatric: She has  a normal mood and affect. Judgment normal.  Nursing note and vitals reviewed.   ED Course  Procedures (including critical care time) DIAGNOSTIC STUDIES: Oxygen Saturation is 100% on RA, normal by my interpretation.    COORDINATION OF CARE: 11:32 AM Discussed treatment plan with pt at bedside and pt agreed to plan.  Labs Review Labs Reviewed - No data to display  Imaging Review No results found.   EKG Interpretation None      MDM   Final diagnoses:  Radiation colitis  Radiation dermatitis    59yF with perineal/butock pain and diarrhea. Most likely radiation induced. Symptoms improved and now tolerable. Has close outpt oncology follow-up.    Virgel Manifold, MD 11/01/15 2158

## 2015-10-24 NOTE — ED Notes (Signed)
Patient given discharge instruction, verbalized understand. IV removed, band aid applied. Patient ambulatory out of the department.  

## 2015-10-24 NOTE — Discharge Instructions (Signed)
°  Colitis °Colitis is inflammation of the colon. Colitis may last a short time (acute) or it may last a long time (chronic). °CAUSES °This condition may be caused by: °· Viruses. °· Bacteria. °· Reactions to medicine. °· Certain autoimmune diseases, such as Crohn disease or ulcerative colitis. °SYMPTOMS °Symptoms of this condition include: °· Diarrhea. °· Passing bloody or tarry stool. °· Pain. °· Fever. °· Vomiting. °· Tiredness (fatigue). °· Weight loss. °· Bloating. °· Sudden increase in abdominal pain. °· Having fewer bowel movements than usual. °DIAGNOSIS °This condition is diagnosed with a stool test or a blood test. You may also have other tests, including X-rays, a CT scan, or a colonoscopy. °TREATMENT °Treatment may include: °· Resting the bowel. This involves not eating or drinking for a period of time. °· Fluids that are given through an IV tube. °· Medicine for pain and diarrhea. °· Antibiotic medicines. °· Cortisone medicines. °· Surgery. °HOME CARE INSTRUCTIONS °Eating and Drinking °· Follow instructions from your health care provider about eating or drinking restrictions. °· Drink enough fluid to keep your urine clear or pale yellow. °· Work with a dietitian to determine which foods cause your condition to flare up. °· Avoid foods that cause flare-ups. °· Eat a well-balanced diet. °Medicines °· Take over-the-counter and prescription medicines only as told by your health care provider. °· If you were prescribed an antibiotic medicine, take it as told by your health care provider. Do not stop taking the antibiotic even if you start to feel better. °General Instructions °· Keep all follow-up visits as told by your health care provider. This is important. °SEEK MEDICAL CARE IF: °· Your symptoms do not go away. °· You develop new symptoms. °SEEK IMMEDIATE MEDICAL CARE IF: °· You have a fever that does not go away with treatment. °· You develop chills. °· You have extreme weakness, fainting, or  dehydration. °· You have repeated vomiting. °· You develop severe pain in your abdomen. °· You pass bloody or tarry stool. °  °This information is not intended to replace advice given to you by your health care provider. Make sure you discuss any questions you have with your health care provider. °  °Document Released: 06/04/2004 Document Revised: 01/16/2015 Document Reviewed: 08/20/2014 °Elsevier Interactive Patient Education ©2016 [employer] ° °

## 2015-10-27 ENCOUNTER — Inpatient Hospital Stay (HOSPITAL_COMMUNITY)
Admission: EM | Admit: 2015-10-27 | Discharge: 2015-11-09 | DRG: 853 | Disposition: E | Payer: Medicare Other | Attending: Pulmonary Disease | Admitting: Pulmonary Disease

## 2015-10-27 ENCOUNTER — Encounter (HOSPITAL_COMMUNITY): Payer: Self-pay | Admitting: Emergency Medicine

## 2015-10-27 DIAGNOSIS — Z4659 Encounter for fitting and adjustment of other gastrointestinal appliance and device: Secondary | ICD-10-CM

## 2015-10-27 DIAGNOSIS — K559 Vascular disorder of intestine, unspecified: Secondary | ICD-10-CM | POA: Diagnosis not present

## 2015-10-27 DIAGNOSIS — Z79899 Other long term (current) drug therapy: Secondary | ICD-10-CM

## 2015-10-27 DIAGNOSIS — Z9889 Other specified postprocedural states: Secondary | ICD-10-CM

## 2015-10-27 DIAGNOSIS — E860 Dehydration: Secondary | ICD-10-CM | POA: Diagnosis present

## 2015-10-27 DIAGNOSIS — A419 Sepsis, unspecified organism: Secondary | ICD-10-CM | POA: Diagnosis not present

## 2015-10-27 DIAGNOSIS — L309 Dermatitis, unspecified: Secondary | ICD-10-CM | POA: Diagnosis present

## 2015-10-27 DIAGNOSIS — K1379 Other lesions of oral mucosa: Secondary | ICD-10-CM | POA: Diagnosis present

## 2015-10-27 DIAGNOSIS — R6521 Severe sepsis with septic shock: Secondary | ICD-10-CM | POA: Diagnosis not present

## 2015-10-27 DIAGNOSIS — I10 Essential (primary) hypertension: Secondary | ICD-10-CM | POA: Diagnosis present

## 2015-10-27 DIAGNOSIS — K7201 Acute and subacute hepatic failure with coma: Secondary | ICD-10-CM | POA: Diagnosis not present

## 2015-10-27 DIAGNOSIS — Z515 Encounter for palliative care: Secondary | ICD-10-CM | POA: Diagnosis present

## 2015-10-27 DIAGNOSIS — K5660 Unspecified intestinal obstruction: Secondary | ICD-10-CM | POA: Diagnosis not present

## 2015-10-27 DIAGNOSIS — E876 Hypokalemia: Secondary | ICD-10-CM | POA: Diagnosis not present

## 2015-10-27 DIAGNOSIS — L598 Other specified disorders of the skin and subcutaneous tissue related to radiation: Secondary | ICD-10-CM | POA: Diagnosis present

## 2015-10-27 DIAGNOSIS — F329 Major depressive disorder, single episode, unspecified: Secondary | ICD-10-CM | POA: Diagnosis present

## 2015-10-27 DIAGNOSIS — K56609 Unspecified intestinal obstruction, unspecified as to partial versus complete obstruction: Secondary | ICD-10-CM | POA: Diagnosis present

## 2015-10-27 DIAGNOSIS — Z7982 Long term (current) use of aspirin: Secondary | ICD-10-CM

## 2015-10-27 DIAGNOSIS — J9601 Acute respiratory failure with hypoxia: Secondary | ICD-10-CM | POA: Diagnosis not present

## 2015-10-27 DIAGNOSIS — Z7983 Long term (current) use of bisphosphonates: Secondary | ICD-10-CM

## 2015-10-27 DIAGNOSIS — L589 Radiodermatitis, unspecified: Secondary | ICD-10-CM | POA: Diagnosis not present

## 2015-10-27 DIAGNOSIS — Z66 Do not resuscitate: Secondary | ICD-10-CM | POA: Diagnosis present

## 2015-10-27 DIAGNOSIS — K659 Peritonitis, unspecified: Secondary | ICD-10-CM | POA: Diagnosis not present

## 2015-10-27 DIAGNOSIS — R000 Tachycardia, unspecified: Secondary | ICD-10-CM

## 2015-10-27 DIAGNOSIS — C210 Malignant neoplasm of anus, unspecified: Secondary | ICD-10-CM | POA: Diagnosis present

## 2015-10-27 DIAGNOSIS — Z79891 Long term (current) use of opiate analgesic: Secondary | ICD-10-CM

## 2015-10-27 DIAGNOSIS — G9340 Encephalopathy, unspecified: Secondary | ICD-10-CM | POA: Diagnosis not present

## 2015-10-27 DIAGNOSIS — R739 Hyperglycemia, unspecified: Secondary | ICD-10-CM | POA: Diagnosis not present

## 2015-10-27 DIAGNOSIS — Y842 Radiological procedure and radiotherapy as the cause of abnormal reaction of the patient, or of later complication, without mention of misadventure at the time of the procedure: Secondary | ICD-10-CM | POA: Diagnosis present

## 2015-10-27 DIAGNOSIS — N179 Acute kidney failure, unspecified: Secondary | ICD-10-CM | POA: Diagnosis present

## 2015-10-27 DIAGNOSIS — D696 Thrombocytopenia, unspecified: Secondary | ICD-10-CM | POA: Diagnosis present

## 2015-10-27 DIAGNOSIS — E872 Acidosis: Secondary | ICD-10-CM | POA: Diagnosis not present

## 2015-10-27 DIAGNOSIS — D649 Anemia, unspecified: Secondary | ICD-10-CM

## 2015-10-27 DIAGNOSIS — D6481 Anemia due to antineoplastic chemotherapy: Secondary | ICD-10-CM | POA: Diagnosis present

## 2015-10-27 DIAGNOSIS — Z978 Presence of other specified devices: Secondary | ICD-10-CM

## 2015-10-27 DIAGNOSIS — J9600 Acute respiratory failure, unspecified whether with hypoxia or hypercapnia: Secondary | ICD-10-CM | POA: Insufficient documentation

## 2015-10-27 DIAGNOSIS — L03315 Cellulitis of perineum: Secondary | ICD-10-CM | POA: Diagnosis present

## 2015-10-27 DIAGNOSIS — K660 Peritoneal adhesions (postprocedural) (postinfection): Secondary | ICD-10-CM | POA: Diagnosis present

## 2015-10-27 DIAGNOSIS — T451X5A Adverse effect of antineoplastic and immunosuppressive drugs, initial encounter: Secondary | ICD-10-CM | POA: Diagnosis present

## 2015-10-27 DIAGNOSIS — N390 Urinary tract infection, site not specified: Secondary | ICD-10-CM | POA: Diagnosis present

## 2015-10-27 DIAGNOSIS — Z9289 Personal history of other medical treatment: Secondary | ICD-10-CM

## 2015-10-27 DIAGNOSIS — B965 Pseudomonas (aeruginosa) (mallei) (pseudomallei) as the cause of diseases classified elsewhere: Secondary | ICD-10-CM | POA: Diagnosis present

## 2015-10-27 DIAGNOSIS — D6959 Other secondary thrombocytopenia: Secondary | ICD-10-CM | POA: Diagnosis present

## 2015-10-27 DIAGNOSIS — J690 Pneumonitis due to inhalation of food and vomit: Secondary | ICD-10-CM | POA: Diagnosis not present

## 2015-10-27 DIAGNOSIS — Z6822 Body mass index (BMI) 22.0-22.9, adult: Secondary | ICD-10-CM

## 2015-10-27 LAB — CBC WITH DIFFERENTIAL/PLATELET
HCT: 30.1 % — ABNORMAL LOW (ref 36.0–46.0)
HEMOGLOBIN: 10.4 g/dL — AB (ref 12.0–15.0)
MCH: 31.3 pg (ref 26.0–34.0)
MCHC: 34.6 g/dL (ref 30.0–36.0)
MCV: 90.7 fL (ref 78.0–100.0)
PLATELETS: 71 10*3/uL — AB (ref 150–400)
RBC: 3.32 MIL/uL — AB (ref 3.87–5.11)
RDW: 15.9 % — ABNORMAL HIGH (ref 11.5–15.5)
WBC: 4.3 10*3/uL (ref 4.0–10.5)

## 2015-10-27 LAB — URINALYSIS, ROUTINE W REFLEX MICROSCOPIC
GLUCOSE, UA: NEGATIVE mg/dL
NITRITE: POSITIVE — AB
PH: 5 (ref 5.0–8.0)
Protein, ur: 100 mg/dL — AB
Specific Gravity, Urine: 1.025 (ref 1.005–1.030)

## 2015-10-27 LAB — COMPREHENSIVE METABOLIC PANEL
ALK PHOS: 84 U/L (ref 38–126)
ALT: 26 U/L (ref 14–54)
ANION GAP: 11 (ref 5–15)
AST: 49 U/L — ABNORMAL HIGH (ref 15–41)
Albumin: 2.4 g/dL — ABNORMAL LOW (ref 3.5–5.0)
BUN: 56 mg/dL — ABNORMAL HIGH (ref 6–20)
CALCIUM: 8.6 mg/dL — AB (ref 8.9–10.3)
CO2: 28 mmol/L (ref 22–32)
CREATININE: 1.17 mg/dL — AB (ref 0.44–1.00)
Chloride: 94 mmol/L — ABNORMAL LOW (ref 101–111)
GFR, EST AFRICAN AMERICAN: 57 mL/min — AB (ref >60)
GFR, EST NON AFRICAN AMERICAN: 50 mL/min — AB (ref >60)
Glucose, Bld: 126 mg/dL — ABNORMAL HIGH (ref 65–99)
Potassium: 3.2 mmol/L — ABNORMAL LOW (ref 3.5–5.1)
SODIUM: 133 mmol/L — AB (ref 135–145)
Total Bilirubin: 1 mg/dL (ref 0.3–1.2)
Total Protein: 6.7 g/dL (ref 6.5–8.1)

## 2015-10-27 LAB — I-STAT CG4 LACTIC ACID, ED: LACTIC ACID, VENOUS: 1.51 mmol/L (ref 0.5–2.0)

## 2015-10-27 LAB — TROPONIN I

## 2015-10-27 LAB — URINE MICROSCOPIC-ADD ON

## 2015-10-27 LAB — D-DIMER, QUANTITATIVE: D-Dimer, Quant: 12.6 ug/mL-FEU — ABNORMAL HIGH (ref 0.00–0.50)

## 2015-10-27 MED ORDER — OXYCODONE HCL ER 15 MG PO T12A
30.0000 mg | EXTENDED_RELEASE_TABLET | Freq: Two times a day (BID) | ORAL | Status: DC
  Administered 2015-10-27 – 2015-10-28 (×2): 30 mg via ORAL
  Filled 2015-10-27 (×2): qty 2

## 2015-10-27 MED ORDER — POTASSIUM CHLORIDE IN NACL 20-0.9 MEQ/L-% IV SOLN
INTRAVENOUS | Status: DC
  Administered 2015-10-27: 20:00:00 via INTRAVENOUS

## 2015-10-27 MED ORDER — SODIUM CHLORIDE 0.9% FLUSH
3.0000 mL | Freq: Two times a day (BID) | INTRAVENOUS | Status: DC
  Administered 2015-10-29 (×2): 3 mL via INTRAVENOUS

## 2015-10-27 MED ORDER — LORATADINE 10 MG PO TABS
10.0000 mg | ORAL_TABLET | Freq: Every day | ORAL | Status: DC
  Administered 2015-10-27 – 2015-10-28 (×2): 10 mg via ORAL
  Filled 2015-10-27 (×2): qty 1

## 2015-10-27 MED ORDER — MAGIC MOUTHWASH W/LIDOCAINE: 5.0000 mL | Freq: Four times a day (QID) | ORAL | Status: DC | PRN

## 2015-10-27 MED ORDER — ONDANSETRON HCL 4 MG/2ML IJ SOLN
4.0000 mg | Freq: Four times a day (QID) | INTRAMUSCULAR | Status: DC | PRN
  Administered 2015-10-27: 4 mg via INTRAVENOUS
  Filled 2015-10-27: qty 2

## 2015-10-27 MED ORDER — HYDROMORPHONE HCL 1 MG/ML IJ SOLN
1.0000 mg | INTRAMUSCULAR | Status: DC | PRN
  Administered 2015-10-27 – 2015-10-28 (×3): 1 mg via INTRAVENOUS
  Filled 2015-10-27 (×3): qty 1

## 2015-10-27 MED ORDER — VENLAFAXINE HCL ER 75 MG PO CP24
150.0000 mg | ORAL_CAPSULE | Freq: Every day | ORAL | Status: DC
  Administered 2015-10-28: 150 mg via ORAL
  Filled 2015-10-27: qty 2

## 2015-10-27 MED ORDER — HYDROMORPHONE HCL 1 MG/ML IJ SOLN
1.0000 mg | Freq: Once | INTRAMUSCULAR | Status: AC
  Administered 2015-10-27: 1 mg via INTRAVENOUS
  Filled 2015-10-27: qty 1

## 2015-10-27 MED ORDER — ASPIRIN EC 81 MG PO TBEC
81.0000 mg | DELAYED_RELEASE_TABLET | Freq: Every day | ORAL | Status: DC
  Administered 2015-10-27 – 2015-10-28 (×2): 81 mg via ORAL
  Filled 2015-10-27 (×2): qty 1

## 2015-10-27 MED ORDER — LIDOCAINE VISCOUS 2 % MT SOLN
5.0000 mL | Freq: Four times a day (QID) | OROMUCOSAL | Status: DC | PRN
  Filled 2015-10-27: qty 15
  Filled 2015-10-27: qty 5

## 2015-10-27 MED ORDER — SERTRALINE HCL 50 MG PO TABS
150.0000 mg | ORAL_TABLET | Freq: Every day | ORAL | Status: DC
  Administered 2015-10-27 – 2015-10-28 (×2): 150 mg via ORAL
  Filled 2015-10-27 (×2): qty 3

## 2015-10-27 MED ORDER — GENTIAN VIOLET 1 % EX SOLN
0.5000 mL | Freq: Once | CUTANEOUS | Status: DC
  Filled 2015-10-27: qty 59

## 2015-10-27 MED ORDER — ACETAMINOPHEN 325 MG PO TABS
650.0000 mg | ORAL_TABLET | Freq: Four times a day (QID) | ORAL | Status: DC | PRN
Start: 2015-10-27 — End: 2015-10-29

## 2015-10-27 MED ORDER — METOPROLOL TARTRATE 25 MG PO TABS
25.0000 mg | ORAL_TABLET | Freq: Two times a day (BID) | ORAL | Status: DC
  Administered 2015-10-27 – 2015-10-28 (×2): 25 mg via ORAL
  Filled 2015-10-27 (×2): qty 1

## 2015-10-27 MED ORDER — ARIPIPRAZOLE 10 MG PO TABS
15.0000 mg | ORAL_TABLET | Freq: Every day | ORAL | Status: DC
  Administered 2015-10-27 – 2015-10-28 (×2): 15 mg via ORAL
  Filled 2015-10-27 (×2): qty 2

## 2015-10-27 MED ORDER — MAGIC MOUTHWASH
5.0000 mL | Freq: Once | ORAL | Status: AC
  Administered 2015-10-27: 5 mL via ORAL
  Filled 2015-10-27: qty 5

## 2015-10-27 MED ORDER — CYCLOBENZAPRINE HCL 10 MG PO TABS: 10.0000 mg | ORAL_TABLET | Freq: Three times a day (TID) | ORAL | Status: DC | PRN

## 2015-10-27 MED ORDER — HEPARIN SODIUM (PORCINE) 5000 UNIT/ML IJ SOLN
5000.0000 [IU] | Freq: Three times a day (TID) | INTRAMUSCULAR | Status: DC
  Administered 2015-10-27: 5000 [IU] via SUBCUTANEOUS
  Filled 2015-10-27: qty 1

## 2015-10-27 MED ORDER — RADIAPLEXRX EX GEL
Freq: Once | CUTANEOUS | Status: DC
  Filled 2015-10-27: qty 170

## 2015-10-27 MED ORDER — VITAMIN D 1000 UNITS PO TABS
2000.0000 [IU] | ORAL_TABLET | Freq: Every morning | ORAL | Status: DC
  Administered 2015-10-28: 2000 [IU] via ORAL
  Filled 2015-10-27: qty 2

## 2015-10-27 MED ORDER — OXYCODONE HCL ER 30 MG PO T12A: 30.0000 mg | EXTENDED_RELEASE_TABLET | Freq: Two times a day (BID) | ORAL | Status: DC

## 2015-10-27 MED ORDER — DIPHENHYDRAMINE HCL 25 MG PO CAPS: 25.0000 mg | ORAL_CAPSULE | Freq: Four times a day (QID) | ORAL | Status: DC | PRN

## 2015-10-27 MED ORDER — DIAZEPAM 5 MG PO TABS: 5.0000 mg | ORAL_TABLET | Freq: Three times a day (TID) | ORAL | Status: DC | PRN

## 2015-10-27 MED ORDER — PROMETHAZINE HCL 12.5 MG PO TABS
25.0000 mg | ORAL_TABLET | Freq: Four times a day (QID) | ORAL | Status: DC | PRN
  Administered 2015-10-28: 25 mg via ORAL
  Filled 2015-10-27: qty 2

## 2015-10-27 MED ORDER — MAGIC MOUTHWASH
5.0000 mL | Freq: Four times a day (QID) | ORAL | Status: DC | PRN
  Filled 2015-10-27 (×2): qty 5

## 2015-10-27 MED ORDER — CLONAZEPAM 0.5 MG PO TABS
0.5000 mg | ORAL_TABLET | Freq: Every day | ORAL | Status: DC
  Administered 2015-10-27: 0.5 mg via ORAL
  Filled 2015-10-27: qty 1

## 2015-10-27 MED ORDER — ACETAMINOPHEN 650 MG RE SUPP: 650.0000 mg | Freq: Four times a day (QID) | RECTAL | Status: DC | PRN

## 2015-10-27 MED ORDER — SODIUM CHLORIDE 0.9 % IV SOLN
INTRAVENOUS | Status: DC
  Administered 2015-10-27: 1000 mL via INTRAVENOUS

## 2015-10-27 MED ORDER — DIPHENOXYLATE-ATROPINE 2.5-0.025 MG PO TABS: 1.0000 | ORAL_TABLET | Freq: Four times a day (QID) | ORAL | Status: DC | PRN

## 2015-10-27 MED ORDER — SODIUM CHLORIDE 0.9 % IV BOLUS (SEPSIS)
1000.0000 mL | Freq: Once | INTRAVENOUS | Status: AC
  Administered 2015-10-27: 1000 mL via INTRAVENOUS

## 2015-10-27 MED ORDER — SILVER SULFADIAZINE 1 % EX CREA
TOPICAL_CREAM | Freq: Two times a day (BID) | CUTANEOUS | Status: DC
  Filled 2015-10-27: qty 85

## 2015-10-27 MED ORDER — ONDANSETRON HCL 4 MG/2ML IJ SOLN
4.0000 mg | Freq: Once | INTRAMUSCULAR | Status: AC
  Administered 2015-10-27: 4 mg via INTRAVENOUS
  Filled 2015-10-27: qty 2

## 2015-10-27 MED ORDER — ALLOPURINOL 100 MG PO TABS
100.0000 mg | ORAL_TABLET | Freq: Every day | ORAL | Status: DC
  Administered 2015-10-27 – 2015-10-28 (×2): 100 mg via ORAL
  Filled 2015-10-27 (×2): qty 1

## 2015-10-27 MED ORDER — OXYBUTYNIN CHLORIDE ER 5 MG PO TB24
5.0000 mg | ORAL_TABLET | Freq: Every day | ORAL | Status: DC
  Administered 2015-10-27: 5 mg via ORAL
  Filled 2015-10-27 (×2): qty 1

## 2015-10-27 NOTE — H&P (Signed)
TRH H&P   Patient Demographics:    Jillian Estrada, is a 60 y.o. female  MRN: [id number]   DOB - 1955-09-15  Admit Date - 10/12/2015  Outpatient Primary MD for the patient is Tobe Sos, MD  Referring MD/NP/PA: Sherwood Gambler  Outpatient Specialists: [place] (oncologist)  Patient coming from: home  Chief Complaint  Patient presents with  . Rectal Cancer      HPI:    Jillian Estrada  is a 60 y.o. female, w/ hx of anal cancer s/p XRT apparently c/o increase inguinal/ perineal pain over the pain x 1week, worse especially today.  Pt was told that this is due to radiation burn.  Pt has burn to the perineal area bilaterally.    In ED, pt was noted to be dehydrated and have mild renal insufficiency and anemia, and thrombocytopenia, and hypokalemia.  Pt will be admitted for pain control as well as mild cellulitis and also mild ARF/ hypokalemia.     Review of systems:    In addition to the HPI above,  No Fever-chills, No Headache, No changes with Vision or hearing, No problems swallowing food or Liquids, No Chest pain, Cough or Shortness of Breath, No Abdominal pain, No Nausea or Vommitting, Bowel movements are regular, No Blood in stool or Urine, No dysuria, No new joints pains-aches,  No new weakness, tingling, numbness in any extremity, No recent weight gain or loss, No polyuria, polydypsia or polyphagia, No significant Mental Stressors.  A full 10 point Review of Systems was done, except as stated above, all other Review of Systems were negative.   With Past History of the following :    Past Medical History  Diagnosis Date  . Depression     ?Bipolar, sees psychiatrist, Sees Dr. Riley Churches in [place]. Hospitalized a few times, Feb 2013 and Aug 2015.   Marland Kitchen Hypertension   . Anal cancer ([place])   . Kidney failure     Lithium overdose unintentional, now resolved  .  Hepatitis C     treated through [facility]  . 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 Anal cancer ([place])   . Cancer [facility])     Anal      Past Surgical History  Procedure Laterality Date  . Back surgery      Nov 2008, Aug 2011  . Small bowel repair      June 2010. Injury to small bowel during ovarian cyst surgery  . Foreign object removal      Aug 2010, surgical tool left during ovarian surgery  . Colonoscopy  2010    [place]: non-bleeding internal hemorrhoids   . Rectal biopsy    . Colonoscopy with propofol N/A 08/27/2015    Procedure: COLONOSCOPY WITH PROPOFOL;  Surgeon: Danie Binder, MD;  Location: [facility];  Service: Endoscopy;  Laterality: N/A;  915  . Portacath placement  Left 08/30/2015    Procedure: INSERTION PORT-A-CATH LEFT SUBCLAVIAN;  Surgeon: Aviva Signs, MD;  Location: [facility] ORS;  Service: General;  Laterality: Left;      Social History:     Social History  Substance Use Topics  . Smoking status: Never Smoker   . Smokeless tobacco: Not on file  . Alcohol Use: 0.0 oz/week    0 Standard drinks or equivalent per week     Comment: occasionally     Lives - at home  Mobility - walks without assistance   Family History :     Family History  Problem Relation Age of Onset  . Colon polyps Father   . Colon cancer Neg Hx   . Heart attack Father   . Diabetes Father       Home Medications:   Prior to Admission medications   Medication Sig Start Date End Date Taking? Authorizing Provider  allopurinol (ZYLOPRIM) 100 MG tablet Take 100 mg by mouth daily.   Yes Historical Provider, MD  ARIPiprazole (ABILIFY) 15 MG tablet Take 15 mg by mouth daily.   Yes Historical Provider, MD  aspirin EC 81 MG tablet Take 1 tablet (81 mg total) by mouth daily. For prevention of stroke and heart attack 07/07/11  Yes Darrol Jump, MD  cetirizine (ZYRTEC) 10 MG tablet Take 10 mg by mouth daily.   Yes Historical Provider, MD  Cholecalciferol (VITAMIN D) 2000  UNITS CAPS Take 1 capsule (2,000 Units total) by mouth every morning. For Vitamin D replacement and mood control. 07/07/11  Yes Darrol Jump, MD  clonazePAM (KLONOPIN) 0.5 MG tablet Take 0.5 mg by mouth at bedtime.    Yes Historical Provider, MD  cyclobenzaprine (FLEXERIL) 10 MG tablet Take 1 tablet (10 mg total) by mouth 3 (three) times daily as needed. For pain 07/07/11  Yes Darrol Jump, MD  diazepam (VALIUM) 5 MG tablet Take 1 tablet (5 mg total) by mouth every 8 (eight) hours as needed for muscle spasms. 10/24/15  Yes Virgel Manifold, MD  diphenoxylate-atropine (LOMOTIL) 2.5-0.025 MG tablet Take 1 tablet by mouth 4 (four) times daily as needed for diarrhea or loose stools. 10/14/15  Yes Patrici Ranks, MD  lidocaine-prilocaine (EMLA) cream Apply a quarter size amount to port site 1 hour prior to chemo. Do not rub in. Cover with plastic wrap. 09/03/15  Yes Patrici Ranks, MD  loperamide (IMODIUM) 2 MG capsule Take 4 mg by mouth as needed for diarrhea or loose stools.   Yes Historical Provider, MD  magic mouthwash SOLN Swish and swallow 62ml four times a day for mouth pain. 10/09/15  Yes Patrici Ranks, MD  meloxicam (MOBIC) 15 MG tablet Take 1 tablet (15 mg total) by mouth daily as needed for pain. 10/24/15  Yes Virgel Manifold, MD  metoprolol tartrate (LOPRESSOR) 25 MG tablet Take 25 mg by mouth 2 (two) times daily.   Yes Historical Provider, MD  ondansetron (ZOFRAN) 8 MG tablet Take 1 tablet (8 mg total) by mouth every 8 (eight) hours as needed for nausea or vomiting. 09/02/15  Yes Patrici Ranks, MD  oxyCODONE (OXYCONTIN) 30 MG 12 hr tablet Take 30 mg by mouth 2 (two) times daily. 10/09/15  Yes Patrici Ranks, MD  oxyCODONE (ROXICODONE) 15 MG immediate release tablet Take 1 tablet (15 mg total) by mouth every 4 (four) hours as needed for pain. 10/14/15  Yes Patrici Ranks, MD  promethazine (PHENERGAN) 25 MG tablet Take 1 tablet (25  mg total) by mouth every 6 (six) hours as needed for nausea  or vomiting. 10/24/15  Yes Virgel Manifold, MD  sertraline (ZOLOFT) 100 MG tablet Take 150 mg by mouth daily.   Yes Historical Provider, MD  tolterodine (DETROL) 2 MG tablet Take 2 mg by mouth daily as needed (bladder). Reported on 08/27/2015   Yes Historical Provider, MD  venlafaxine XR (EFFEXOR-XR) 150 MG 24 hr capsule Take 150 mg by mouth daily with breakfast.   Yes Historical Provider, MD  alendronate (FOSAMAX) 70 MG tablet Take 70 mg by mouth once a week. Reported on 10/21/2015    Historical Provider, MD     Allergies:     Allergies  Allergen Reactions  . Penicillins Hives    Has patient had a PCN reaction causing immediate rash, facial/tongue/throat swelling, SOB or lightheadedness with hypotension: No Has patient had a PCN reaction causing severe rash involving mucus membranes or skin necrosis: No Has patient had a PCN reaction that required hospitalization No Has patient had a PCN reaction occurring within the last 10 years: No If all of the above answers are "NO", then may proceed with Cephalosporin use.   . Codeine Nausea Only     Physical Exam:   Vitals  Blood pressure 146/92, pulse 109, temperature 97.7 F (36.5 C), temperature source Temporal, resp. rate 16, height 5\' 4"  (1.626 m), weight 60.782 kg (134 lb), SpO2 93 %.   1. General  lying in bed in NAD,    2. Normal affect and insight, Not Suicidal or Homicidal, Awake Alert, Oriented X 3.  3. No F.N deficits, ALL C.Nerves Intact, Strength 5/5 all 4 extremities, Sensation intact all 4 extremities, Plantars down going.  4. Ears and Eyes appear Normal, Conjunctivae clear, PERRLA. Moist Oral Mucosa.  5. Supple Neck, No JVD, No cervical lymphadenopathy appriciated, No Carotid Bruits.  6. Symmetrical Chest wall movement, Good air movement bilaterally, CTAB.  7. RRR, No Gallops, Rubs or Murmurs, No Parasternal Heave.  8. Positive Bowel Sounds, Abdomen Soft, No tenderness, No organomegaly appriciated,No rebound -guarding  or rigidity.  9.  No Cyanosis, Normal Skin Turgor, + redness of the bilateral perineal area,  10. Good muscle tone,  joints appear normal , no effusions, Normal ROM.  11. No Palpable Lymph Nodes in Neck or Axillae     Data Review:    CBC  Recent Labs Lab 10/21/15 0839 10/11/2015 1447  WBC 1.5* 4.3  HGB 10.7* 10.4*  HCT 30.8* 30.1*  PLT 166 71*  MCV 91.4 90.7  MCH 31.8 31.3  MCHC 34.7 34.6  RDW 14.5 15.9*  LYMPHSABS 0.1*  --   MONOABS 0.1  --   EOSABS 0.0  --   BASOSABS 0.0  --    ------------------------------------------------------------------------------------------------------------------  Chemistries   Recent Labs Lab 10/21/15 0839 10/23/2015 1447  NA 136 133*  K 4.3 3.2*  CL 102 94*  CO2 28 28  GLUCOSE 172* 126*  BUN 15 56*  CREATININE 0.73 1.17*  CALCIUM 9.1 8.6*  AST 19 49*  ALT 13* 26  ALKPHOS 45 84  BILITOT 0.7 1.0   ------------------------------------------------------------------------------------------------------------------ estimated creatinine clearance is 44.2 mL/min (by C-G formula based on Cr of 1.17). ------------------------------------------------------------------------------------------------------------------ No results for input(s): TSH, T4TOTAL, T3FREE, THYROIDAB in the last 72 hours.  Invalid input(s): FREET3  Coagulation profile No results for input(s): INR, PROTIME in the last 168 hours. ------------------------------------------------------------------------------------------------------------------- No results for input(s): DDIMER in the last 72 hours. -------------------------------------------------------------------------------------------------------------------  Cardiac Enzymes No results for input(s):  CKMB, TROPONINI, MYOGLOBIN in the last 168 hours.  Invalid input(s): CK ------------------------------------------------------------------------------------------------------------------ No results found for:  BNP   ---------------------------------------------------------------------------------------------------------------  Urinalysis    Component Value Date/Time   COLORURINE YELLOW 10/23/2015 1615   APPEARANCEUR CLOUDY* 10/14/2015 1615   LABSPEC 1.025 10/24/2015 1615   PHURINE 5.0 10/19/2015 1615   GLUCOSEU NEGATIVE 10/18/2015 1615   HGBUR LARGE* 10/18/2015 1615   BILIRUBINUR MODERATE* 10/14/2015 1615   KETONESUR TRACE* 10/16/2015 1615   PROTEINUR 100* 10/30/2015 1615   UROBILINOGEN 1.0 07/02/2011 0931   NITRITE POSITIVE* 10/10/2015 1615   LEUKOCYTESUR MODERATE* 10/26/2015 1615    ----------------------------------------------------------------------------------------------------------------   Imaging Results:    No results found.     Assessment & Plan:    Active Problems:   Acute kidney injury (HCC)   Hypokalemia   Anemia   Hyperglycemia    1.  Radiation burn Silvadene Wound care rn to evaluate and tx Dilaudid 1mg  iv q4h prn  2. ARF Likely due to dehydration Ns iv Check cmp in am  3. Anemia/ thrombocytopenia likely secondary to chemo Repeat cbc in am  4.  Hyperglycemia Check hga1c  5. Hypokalemia Replete  6. Tachycardia ? Secondary to pain Check trop i q6h x3 Check tsh Check cardiac echo r/o cardiomyopathy  7. Mouth sore Magic mouth wash  DVT Prophylaxis Heparin - SCDs   AM Labs Ordered, also please review Full Orders  Family Communication: Admission, patients condition and plan of care including tests being ordered have been discussed with the patient who indicate understanding and agree with the plan and Code Status.  Code Status FULL CODE  Likely DC to    Condition GUARDED    Consults called:   Admission status: observation  Time spent in minutes : 45 minutes   Jani Gravel M.D on 10/31/2015 at 4:52 PM  Between 7am to 7pm - Pager - [phone]. After 7pm go to [email or web address] - password [email or web address]  [facility] - Office   [phone]

## 2015-10-27 NOTE — ED Notes (Signed)
Patient reports feeling nauseated from pain medication. EDP aware, verbal order given.

## 2015-10-27 NOTE — ED Provider Notes (Signed)
CSN: [id number]     Arrival date & time 11/02/2015  1321 History   First MD Initiated Contact with Patient 11/08/2015 1400     Chief Complaint  Patient presents with  . Rectal Cancer     (Consider location/radiation/quality/duration/timing/severity/associated sxs/prior Treatment) HPI  60 year old female presents with severe buttocks pain and rectal pain as well as severe skin breakdown. Has been ongoing for about one week. She has 3 doses left of radiation for anal cancer. Breakdown seems to be worsening and is also anterior over her vagina. Has severe burning when urinating, this improved a little bit with Cipro but now is getting worse. Her urine output has been decreased. She is barely eating and drinking due to mouth pain. She is on magic mouthwash but seems to have oral lesions from her chemotherapy, most recently one week ago. Denies fevers. Has chronic abdominal pain but this is not worse today. Was seen here a few days ago and her pain was better and she went home. However now the pain is uncontrollable. She is on oxycodone at home but states she has not taken it since last night because it is "not working". She is also the Valium she was given. Family has tried aloe-vera without relief.  Past Medical History  Diagnosis Date  . Depression     ?Bipolar, sees psychiatrist, Sees Dr. Riley Churches in [place]. Hospitalized a few times, Feb 2013 and Aug 2015.   Marland Kitchen Hypertension   . Anal cancer ([place])   . Kidney failure     Lithium overdose unintentional, now resolved  . Hepatitis C     treated through [facility]  . 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 Anal cancer ([place])   . Cancer [facility])     Anal   Past Surgical History  Procedure Laterality Date  . Back surgery      Nov 2008, Aug 2011  . Small bowel repair      June 2010. Injury to small bowel during ovarian cyst surgery  . Foreign object removal      Aug 2010, surgical tool left during ovarian surgery  . Colonoscopy   2010    [place]: non-bleeding internal hemorrhoids   . Rectal biopsy    . Colonoscopy with propofol N/A 08/27/2015    Procedure: COLONOSCOPY WITH PROPOFOL;  Surgeon: Danie Binder, MD;  Location[facility];  Service: Endoscopy;  Laterality: N/A;  915  . Portacath placement Left 08/30/2015    Procedure: INSERTION PORT-A-CATH LEFT SUBCLAVIAN;  Surgeon: Aviva Signs, MD;  Location: [facility] ORS;  Service: General;  Laterality: Left;   Family History  Problem Relation Age of Onset  . Colon polyps Father   . Colon cancer Neg Hx    Social History  Substance Use Topics  . Smoking status: Never Smoker   . Smokeless tobacco: None  . Alcohol Use: 0.0 oz/week    0 Standard drinks or equivalent per week     Comment: occasionally   OB History    No data available     Review of Systems  Constitutional: Negative for fever.  Gastrointestinal: Positive for abdominal pain, diarrhea and rectal pain.  Genitourinary: Positive for dysuria and decreased urine volume.  Skin: Positive for color change.  All other systems reviewed and are negative.     Allergies  Penicillins and Codeine  Home Medications   Prior to Admission medications   Medication Sig Start Date End Date Taking? Authorizing Provider  allopurinol (ZYLOPRIM) 100  MG tablet Take 100 mg by mouth daily.   Yes Historical Provider, MD  ARIPiprazole (ABILIFY) 15 MG tablet Take 15 mg by mouth daily.   Yes Historical Provider, MD  aspirin EC 81 MG tablet Take 1 tablet (81 mg total) by mouth daily. For prevention of stroke and heart attack 07/07/11  Yes Darrol Jump, MD  cetirizine (ZYRTEC) 10 MG tablet Take 10 mg by mouth daily.   Yes Historical Provider, MD  Cholecalciferol (VITAMIN D) 2000 UNITS CAPS Take 1 capsule (2,000 Units total) by mouth every morning. For Vitamin D replacement and mood control. 07/07/11  Yes Darrol Jump, MD  clonazePAM (KLONOPIN) 0.5 MG tablet Take 0.5 mg by mouth at bedtime.    Yes Historical Provider, MD    cyclobenzaprine (FLEXERIL) 10 MG tablet Take 1 tablet (10 mg total) by mouth 3 (three) times daily as needed. For pain 07/07/11  Yes Darrol Jump, MD  diazepam (VALIUM) 5 MG tablet Take 1 tablet (5 mg total) by mouth every 8 (eight) hours as needed for muscle spasms. 10/24/15  Yes Virgel Manifold, MD  diphenoxylate-atropine (LOMOTIL) 2.5-0.025 MG tablet Take 1 tablet by mouth 4 (four) times daily as needed for diarrhea or loose stools. 10/14/15  Yes Patrici Ranks, MD  lidocaine-prilocaine (EMLA) cream Apply a quarter size amount to port site 1 hour prior to chemo. Do not rub in. Cover with plastic wrap. 09/03/15  Yes Patrici Ranks, MD  loperamide (IMODIUM) 2 MG capsule Take 4 mg by mouth as needed for diarrhea or loose stools.   Yes Historical Provider, MD  magic mouthwash SOLN Swish and swallow 39ml four times a day for mouth pain. 10/09/15  Yes Patrici Ranks, MD  meloxicam (MOBIC) 15 MG tablet Take 1 tablet (15 mg total) by mouth daily as needed for pain. 10/24/15  Yes Virgel Manifold, MD  metoprolol tartrate (LOPRESSOR) 25 MG tablet Take 25 mg by mouth 2 (two) times daily.   Yes Historical Provider, MD  ondansetron (ZOFRAN) 8 MG tablet Take 1 tablet (8 mg total) by mouth every 8 (eight) hours as needed for nausea or vomiting. 09/02/15  Yes Patrici Ranks, MD  oxyCODONE (OXYCONTIN) 30 MG 12 hr tablet Take 30 mg by mouth 2 (two) times daily. 10/09/15  Yes Patrici Ranks, MD  oxyCODONE (ROXICODONE) 15 MG immediate release tablet Take 1 tablet (15 mg total) by mouth every 4 (four) hours as needed for pain. 10/14/15  Yes Patrici Ranks, MD  promethazine (PHENERGAN) 25 MG tablet Take 1 tablet (25 mg total) by mouth every 6 (six) hours as needed for nausea or vomiting. 10/24/15  Yes Virgel Manifold, MD  sertraline (ZOLOFT) 100 MG tablet Take 150 mg by mouth daily.   Yes Historical Provider, MD  tolterodine (DETROL) 2 MG tablet Take 2 mg by mouth daily as needed (bladder). Reported on 08/27/2015   Yes  Historical Provider, MD  venlafaxine XR (EFFEXOR-XR) 150 MG 24 hr capsule Take 150 mg by mouth daily with breakfast.   Yes Historical Provider, MD  alendronate (FOSAMAX) 70 MG tablet Take 70 mg by mouth once a week. Reported on 10/21/2015    Historical Provider, MD   BP 117/79 mmHg  Pulse 114  Temp(Src) 97.7 F (36.5 C) (Temporal)  Resp 18  Ht 5\' 4"  (1.626 m)  Wt 134 lb (60.782 kg)  BMI 22.99 kg/m2  SpO2 97% Physical Exam  Constitutional: She is oriented to person, place, and time. She appears  well-developed and well-nourished.  HENT:  Head: Normocephalic and atraumatic.  Right Ear: External ear normal.  Left Ear: External ear normal.  Nose: Nose normal.  No obvious intra-oral lesions seen  Eyes: Right eye exhibits no discharge. Left eye exhibits no discharge.  Cardiovascular: Regular rhythm and normal heart sounds.  Tachycardia present.   Pulmonary/Chest: Effort normal and breath sounds normal.  Abdominal: Soft. There is tenderness (diffuse, mild).  Neurological: She is alert and oriented to person, place, and time.  Skin: Skin is warm and dry. There is erythema.     Nursing note and vitals reviewed.   ED Course  Procedures (including critical care time) Labs Review Labs Reviewed  COMPREHENSIVE METABOLIC PANEL - Abnormal; Notable for the following:    Sodium 133 (*)    Potassium 3.2 (*)    Chloride 94 (*)    Glucose, Bld 126 (*)    BUN 56 (*)    Creatinine, Ser 1.17 (*)    Calcium 8.6 (*)    Albumin 2.4 (*)    AST 49 (*)    GFR calc non Af Amer 50 (*)    GFR calc Af Amer 57 (*)    All other components within normal limits  CBC WITH DIFFERENTIAL/PLATELET - Abnormal; Notable for the following:    RBC 3.32 (*)    Hemoglobin 10.4 (*)    HCT 30.1 (*)    RDW 15.9 (*)    Platelets 71 (*)    All other components within normal limits  URINALYSIS, ROUTINE W REFLEX MICROSCOPIC (NOT AT [facility]) - Abnormal; Notable for the following:    APPearance CLOUDY (*)    Hgb urine  dipstick LARGE (*)    Bilirubin Urine MODERATE (*)    Ketones, ur TRACE (*)    Protein, ur 100 (*)    Nitrite POSITIVE (*)    Leukocytes, UA MODERATE (*)    All other components within normal limits  URINE MICROSCOPIC-ADD ON - Abnormal; Notable for the following:    Squamous Epithelial / LPF 0-5 (*)    Bacteria, UA MANY (*)    Casts GRANULAR CAST (*)    All other components within normal limits  URINE CULTURE  I-STAT CG4 LACTIC ACID, ED    Imaging Review No results found. I have personally reviewed and evaluated these images and lab results as part of my medical decision-making.   EKG Interpretation None      MDM   Final diagnoses:  Acute kidney injury ([place])  Radiation-induced dermatitis    Patient with poorly controlled pain from what appears to be radiation-induced dermatitis. Has been having decreased oral intake and increased diarrhea and has an acute kidney injury as well. Will be given IV fluids. Given IV narcotics for pain. Discussed with her oncologist, Dr. Whitney Muse, who recommends sitz baths. Discussed with hospitalist, Dr. Maudie Mercury, who will admit for observation, pain control, and fluids.    Sherwood Gambler, MD 10/16/2015 1740

## 2015-10-27 NOTE — ED Notes (Signed)
Pt states she is receiving radiation for rectal cancer and her skin is raw and peeling off.  States she was here a few days ago for same, but is worse and feels she needs to be admitted for pain control.

## 2015-10-28 ENCOUNTER — Inpatient Hospital Stay (HOSPITAL_COMMUNITY): Payer: Medicare Other | Admitting: Anesthesiology

## 2015-10-28 ENCOUNTER — Encounter (HOSPITAL_COMMUNITY): Admission: EM | Disposition: E | Payer: Self-pay | Source: Home / Self Care | Attending: Pulmonary Disease

## 2015-10-28 ENCOUNTER — Other Ambulatory Visit (HOSPITAL_COMMUNITY): Payer: Self-pay | Admitting: Oncology

## 2015-10-28 ENCOUNTER — Inpatient Hospital Stay (HOSPITAL_COMMUNITY): Payer: Medicare Other

## 2015-10-28 ENCOUNTER — Inpatient Hospital Stay
Admission: AD | Admit: 2015-10-28 | Payer: Self-pay | Source: Other Acute Inpatient Hospital | Admitting: Pulmonary Disease

## 2015-10-28 ENCOUNTER — Observation Stay (HOSPITAL_COMMUNITY): Payer: Medicare Other

## 2015-10-28 ENCOUNTER — Ambulatory Visit (HOSPITAL_COMMUNITY): Payer: Self-pay | Admitting: Oncology

## 2015-10-28 DIAGNOSIS — N390 Urinary tract infection, site not specified: Secondary | ICD-10-CM | POA: Diagnosis present

## 2015-10-28 DIAGNOSIS — D6481 Anemia due to antineoplastic chemotherapy: Secondary | ICD-10-CM | POA: Diagnosis present

## 2015-10-28 DIAGNOSIS — E860 Dehydration: Secondary | ICD-10-CM | POA: Diagnosis present

## 2015-10-28 DIAGNOSIS — D696 Thrombocytopenia, unspecified: Secondary | ICD-10-CM

## 2015-10-28 DIAGNOSIS — L309 Dermatitis, unspecified: Secondary | ICD-10-CM | POA: Diagnosis present

## 2015-10-28 DIAGNOSIS — N179 Acute kidney failure, unspecified: Secondary | ICD-10-CM | POA: Diagnosis present

## 2015-10-28 DIAGNOSIS — F329 Major depressive disorder, single episode, unspecified: Secondary | ICD-10-CM | POA: Diagnosis present

## 2015-10-28 DIAGNOSIS — K659 Peritonitis, unspecified: Secondary | ICD-10-CM | POA: Diagnosis not present

## 2015-10-28 DIAGNOSIS — J9601 Acute respiratory failure with hypoxia: Secondary | ICD-10-CM | POA: Diagnosis not present

## 2015-10-28 DIAGNOSIS — C210 Malignant neoplasm of anus, unspecified: Secondary | ICD-10-CM | POA: Diagnosis present

## 2015-10-28 DIAGNOSIS — E876 Hypokalemia: Secondary | ICD-10-CM

## 2015-10-28 DIAGNOSIS — Z515 Encounter for palliative care: Secondary | ICD-10-CM | POA: Diagnosis present

## 2015-10-28 DIAGNOSIS — K559 Vascular disorder of intestine, unspecified: Secondary | ICD-10-CM | POA: Diagnosis not present

## 2015-10-28 DIAGNOSIS — T451X5A Adverse effect of antineoplastic and immunosuppressive drugs, initial encounter: Secondary | ICD-10-CM | POA: Diagnosis present

## 2015-10-28 DIAGNOSIS — L598 Other specified disorders of the skin and subcutaneous tissue related to radiation: Secondary | ICD-10-CM | POA: Diagnosis present

## 2015-10-28 DIAGNOSIS — L03315 Cellulitis of perineum: Secondary | ICD-10-CM | POA: Diagnosis present

## 2015-10-28 DIAGNOSIS — Z79891 Long term (current) use of opiate analgesic: Secondary | ICD-10-CM | POA: Diagnosis not present

## 2015-10-28 DIAGNOSIS — K7201 Acute and subacute hepatic failure with coma: Secondary | ICD-10-CM | POA: Diagnosis not present

## 2015-10-28 DIAGNOSIS — Y842 Radiological procedure and radiotherapy as the cause of abnormal reaction of the patient, or of later complication, without mention of misadventure at the time of the procedure: Secondary | ICD-10-CM | POA: Diagnosis present

## 2015-10-28 DIAGNOSIS — L589 Radiodermatitis, unspecified: Secondary | ICD-10-CM | POA: Diagnosis present

## 2015-10-28 DIAGNOSIS — Z66 Do not resuscitate: Secondary | ICD-10-CM | POA: Diagnosis present

## 2015-10-28 DIAGNOSIS — I10 Essential (primary) hypertension: Secondary | ICD-10-CM | POA: Diagnosis present

## 2015-10-28 DIAGNOSIS — K56609 Unspecified intestinal obstruction, unspecified as to partial versus complete obstruction: Secondary | ICD-10-CM | POA: Diagnosis present

## 2015-10-28 DIAGNOSIS — J9600 Acute respiratory failure, unspecified whether with hypoxia or hypercapnia: Secondary | ICD-10-CM | POA: Diagnosis not present

## 2015-10-28 DIAGNOSIS — Z7982 Long term (current) use of aspirin: Secondary | ICD-10-CM | POA: Diagnosis not present

## 2015-10-28 DIAGNOSIS — K1379 Other lesions of oral mucosa: Secondary | ICD-10-CM | POA: Diagnosis present

## 2015-10-28 DIAGNOSIS — J690 Pneumonitis due to inhalation of food and vomit: Secondary | ICD-10-CM | POA: Diagnosis not present

## 2015-10-28 DIAGNOSIS — R000 Tachycardia, unspecified: Secondary | ICD-10-CM | POA: Diagnosis not present

## 2015-10-28 DIAGNOSIS — K5660 Unspecified intestinal obstruction: Secondary | ICD-10-CM | POA: Diagnosis not present

## 2015-10-28 DIAGNOSIS — K660 Peritoneal adhesions (postprocedural) (postinfection): Secondary | ICD-10-CM | POA: Diagnosis present

## 2015-10-28 DIAGNOSIS — Z7983 Long term (current) use of bisphosphonates: Secondary | ICD-10-CM | POA: Diagnosis not present

## 2015-10-28 DIAGNOSIS — Z79899 Other long term (current) drug therapy: Secondary | ICD-10-CM | POA: Diagnosis not present

## 2015-10-28 DIAGNOSIS — G9340 Encephalopathy, unspecified: Secondary | ICD-10-CM | POA: Diagnosis not present

## 2015-10-28 DIAGNOSIS — B965 Pseudomonas (aeruginosa) (mallei) (pseudomallei) as the cause of diseases classified elsewhere: Secondary | ICD-10-CM | POA: Diagnosis present

## 2015-10-28 DIAGNOSIS — E872 Acidosis: Secondary | ICD-10-CM | POA: Diagnosis not present

## 2015-10-28 DIAGNOSIS — A419 Sepsis, unspecified organism: Secondary | ICD-10-CM | POA: Diagnosis present

## 2015-10-28 DIAGNOSIS — D6959 Other secondary thrombocytopenia: Secondary | ICD-10-CM | POA: Diagnosis present

## 2015-10-28 DIAGNOSIS — Z6822 Body mass index (BMI) 22.0-22.9, adult: Secondary | ICD-10-CM | POA: Diagnosis not present

## 2015-10-28 DIAGNOSIS — R6521 Severe sepsis with septic shock: Secondary | ICD-10-CM | POA: Diagnosis not present

## 2015-10-28 DIAGNOSIS — R739 Hyperglycemia, unspecified: Secondary | ICD-10-CM | POA: Diagnosis present

## 2015-10-28 HISTORY — PX: LAPAROTOMY: SHX154

## 2015-10-28 LAB — COMPREHENSIVE METABOLIC PANEL
ALT: 28 U/L (ref 14–54)
ALT: 169 U/L — ABNORMAL HIGH (ref 14–54)
ANION GAP: 15 (ref 5–15)
AST: 40 U/L (ref 15–41)
AST: 459 U/L — ABNORMAL HIGH (ref 15–41)
Albumin: 2.4 g/dL — ABNORMAL LOW (ref 3.5–5.0)
Albumin: 1.5 g/dL — ABNORMAL LOW (ref 3.5–5.0)
Alkaline Phosphatase: 82 U/L (ref 38–126)
Alkaline Phosphatase: 71 U/L (ref 38–126)
Anion gap: 11 (ref 5–15)
BUN: 69 mg/dL — ABNORMAL HIGH (ref 6–20)
BUN: 76 mg/dL — ABNORMAL HIGH (ref 6–20)
CHLORIDE: 96 mmol/L — AB (ref 101–111)
CHLORIDE: 103 mmol/L (ref 101–111)
CO2: 27 mmol/L (ref 22–32)
CO2: 22 mmol/L (ref 22–32)
Calcium: 8.5 mg/dL — ABNORMAL LOW (ref 8.9–10.3)
Calcium: 7.1 mg/dL — ABNORMAL LOW (ref 8.9–10.3)
Creatinine, Ser: 1.34 mg/dL — ABNORMAL HIGH (ref 0.44–1.00)
Creatinine, Ser: 2.05 mg/dL — ABNORMAL HIGH (ref 0.44–1.00)
GFR calc non Af Amer: 42 mL/min — ABNORMAL LOW (ref >60)
GFR calc non Af Amer: 25 mL/min — ABNORMAL LOW (ref >60)
GFR, EST AFRICAN AMERICAN: 49 mL/min — AB (ref >60)
GFR, EST AFRICAN AMERICAN: 29 mL/min — AB (ref >60)
GLUCOSE: 202 mg/dL — AB (ref 65–99)
Glucose, Bld: 167 mg/dL — ABNORMAL HIGH (ref 65–99)
POTASSIUM: 3.5 mmol/L (ref 3.5–5.1)
Potassium: 3.3 mmol/L — ABNORMAL LOW (ref 3.5–5.1)
SODIUM: 138 mmol/L (ref 135–145)
Sodium: 136 mmol/L (ref 135–145)
TOTAL PROTEIN: 5 g/dL — AB (ref 6.5–8.1)
Total Bilirubin: 1 mg/dL (ref 0.3–1.2)
Total Bilirubin: 1.4 mg/dL — ABNORMAL HIGH (ref 0.3–1.2)
Total Protein: 6.8 g/dL (ref 6.5–8.1)

## 2015-10-28 LAB — CBC
HCT: 35.3 % — ABNORMAL LOW (ref 36.0–46.0)
HCT: 33.5 % — ABNORMAL LOW (ref 36.0–46.0)
HEMOGLOBIN: 12.3 g/dL (ref 12.0–15.0)
Hemoglobin: 11.2 g/dL — ABNORMAL LOW (ref 12.0–15.0)
MCH: 31.9 pg (ref 26.0–34.0)
MCH: 31.7 pg (ref 26.0–34.0)
MCHC: 34.8 g/dL (ref 30.0–36.0)
MCHC: 33.4 g/dL (ref 30.0–36.0)
MCV: 91.7 fL (ref 78.0–100.0)
MCV: 94.9 fL (ref 78.0–100.0)
Platelets: 73 10*3/uL — ABNORMAL LOW (ref 150–400)
Platelets: 64 10*3/uL — ABNORMAL LOW (ref 150–400)
RBC: 3.85 MIL/uL — AB (ref 3.87–5.11)
RBC: 3.53 MIL/uL — ABNORMAL LOW (ref 3.87–5.11)
RDW: 16.2 % — ABNORMAL HIGH (ref 11.5–15.5)
RDW: 16.8 % — AB (ref 11.5–15.5)
WBC: 5.2 10*3/uL (ref 4.0–10.5)
WBC: 10.5 10*3/uL (ref 4.0–10.5)

## 2015-10-28 LAB — BASIC METABOLIC PANEL
ANION GAP: 23 — AB (ref 5–15)
BUN: 78 mg/dL — ABNORMAL HIGH (ref 6–20)
CO2: 14 mmol/L — AB (ref 22–32)
Calcium: 7.8 mg/dL — ABNORMAL LOW (ref 8.9–10.3)
Chloride: 101 mmol/L (ref 101–111)
Creatinine, Ser: 1.79 mg/dL — ABNORMAL HIGH (ref 0.44–1.00)
GFR calc non Af Amer: 30 mL/min — ABNORMAL LOW (ref >60)
GFR, EST AFRICAN AMERICAN: 34 mL/min — AB (ref >60)
GLUCOSE: 148 mg/dL — AB (ref 65–99)
POTASSIUM: 3.7 mmol/L (ref 3.5–5.1)
Sodium: 138 mmol/L (ref 135–145)

## 2015-10-28 LAB — BLOOD GAS, ARTERIAL
Acid-base deficit: 15.6 mmol/L — ABNORMAL HIGH (ref 0.0–2.0)
Bicarbonate: 12.4 mEq/L — ABNORMAL LOW (ref 20.0–24.0)
DRAWN BY: 234301
FIO2: 100
MECHVT: 440 mL
O2 Saturation: 98.1 %
PEEP: 5 cmH2O
PO2 ART: 176 mmHg — AB (ref 80.0–100.0)
RATE: 16 resp/min
pCO2 arterial: 31.5 mmHg — ABNORMAL LOW (ref 35.0–45.0)
pH, Arterial: 7.176 — CL (ref 7.350–7.450)

## 2015-10-28 LAB — TROPONIN I
Troponin I: 0.03 ng/mL (ref <0.031)
Troponin I: <0.03 ng/mL (ref <0.031)

## 2015-10-28 LAB — TYPE AND SCREEN
ABO/RH(D): O POS
Antibody Screen: NEGATIVE

## 2015-10-28 LAB — LACTIC ACID, PLASMA
LACTIC ACID, VENOUS: 11 mmol/L — AB (ref 0.5–2.0)
LACTIC ACID, VENOUS: 4.8 mmol/L — AB (ref 0.5–2.0)

## 2015-10-28 LAB — MRSA PCR SCREENING: MRSA BY PCR: NEGATIVE

## 2015-10-28 LAB — PROTIME-INR
INR: 2.96 — AB (ref 0.00–1.49)
PROTHROMBIN TIME: 30.3 s — AB (ref 11.6–15.2)

## 2015-10-28 LAB — TSH: TSH: 0.217 u[IU]/mL — AB (ref 0.350–4.500)

## 2015-10-28 LAB — MAGNESIUM: MAGNESIUM: 2.5 mg/dL — AB (ref 1.7–2.4)

## 2015-10-28 LAB — PHOSPHORUS: Phosphorus: 4.1 mg/dL (ref 2.5–4.6)

## 2015-10-28 LAB — ABO/RH: ABO/RH(D): O POS

## 2015-10-28 LAB — T4, FREE: Free T4: 1.31 ng/dL — ABNORMAL HIGH (ref 0.61–1.12)

## 2015-10-28 SURGERY — LAPAROTOMY, EXPLORATORY
Anesthesia: General

## 2015-10-28 MED ORDER — LACTATED RINGERS IV SOLN
INTRAVENOUS | Status: DC | PRN
  Administered 2015-10-28: 23:00:00 via INTRAVENOUS

## 2015-10-28 MED ORDER — VASOPRESSIN 20 UNIT/ML IV SOLN
INTRAVENOUS | Status: AC
  Filled 2015-10-28: qty 1

## 2015-10-28 MED ORDER — SODIUM CHLORIDE 0.9 % IV BOLUS (SEPSIS)
1000.0000 mL | Freq: Once | INTRAVENOUS | Status: AC
  Administered 2015-10-28: 1000 mL via INTRAVENOUS

## 2015-10-28 MED ORDER — CHLORHEXIDINE GLUCONATE 0.12% ORAL RINSE (MEDLINE KIT): 15.0000 mL | Freq: Two times a day (BID) | OROMUCOSAL | Status: DC

## 2015-10-28 MED ORDER — MIDAZOLAM HCL 2 MG/2ML IJ SOLN: 2.0000 mg | INTRAMUSCULAR | Status: DC | PRN

## 2015-10-28 MED ORDER — SODIUM BICARBONATE 8.4 % IV SOLN: 100.0000 meq | Freq: Once | INTRAVENOUS | Status: DC

## 2015-10-28 MED ORDER — LEVOFLOXACIN IN D5W 500 MG/100ML IV SOLN
500.0000 mg | INTRAVENOUS | Status: DC
  Administered 2015-10-28: 500 mg via INTRAVENOUS
  Filled 2015-10-28: qty 100

## 2015-10-28 MED ORDER — FAMOTIDINE IN NACL 20-0.9 MG/50ML-% IV SOLN
20.0000 mg | INTRAVENOUS | Status: DC
  Filled 2015-10-28: qty 50

## 2015-10-28 MED ORDER — SUCCINYLCHOLINE CHLORIDE 200 MG/10ML IV SOSY
PREFILLED_SYRINGE | INTRAVENOUS | Status: AC
  Filled 2015-10-28: qty 10

## 2015-10-28 MED ORDER — METRONIDAZOLE IN NACL 5-0.79 MG/ML-% IV SOLN
500.0000 mg | Freq: Once | INTRAVENOUS | Status: AC
  Administered 2015-10-28: 500 mg via INTRAVENOUS
  Filled 2015-10-28: qty 100

## 2015-10-28 MED ORDER — PROPOFOL 10 MG/ML IV BOLUS
INTRAVENOUS | Status: AC
  Filled 2015-10-28: qty 20

## 2015-10-28 MED ORDER — MIDAZOLAM HCL 2 MG/2ML IJ SOLN
INTRAMUSCULAR | Status: AC
  Filled 2015-10-28: qty 2

## 2015-10-28 MED ORDER — SODIUM CHLORIDE 0.9 % IV SOLN
25.0000 ug/h | INTRAVENOUS | Status: DC
  Administered 2015-10-29: 250 ug/h via INTRAVENOUS
  Filled 2015-10-28: qty 50

## 2015-10-28 MED ORDER — FENTANYL BOLUS VIA INFUSION
50.0000 ug | INTRAVENOUS | Status: DC | PRN
  Filled 2015-10-28: qty 50

## 2015-10-28 MED ORDER — VANCOMYCIN HCL IN DEXTROSE 1-5 GM/200ML-% IV SOLN
INTRAVENOUS | Status: AC
  Filled 2015-10-28: qty 200

## 2015-10-28 MED ORDER — ONDANSETRON HCL 4 MG/2ML IJ SOLN
INTRAMUSCULAR | Status: AC
  Filled 2015-10-28: qty 2

## 2015-10-28 MED ORDER — SODIUM CHLORIDE 0.9 % IJ SOLN
INTRAMUSCULAR | Status: AC
  Filled 2015-10-28: qty 20

## 2015-10-28 MED ORDER — LEVOFLOXACIN IN D5W 750 MG/150ML IV SOLN
750.0000 mg | Freq: Once | INTRAVENOUS | Status: AC
  Administered 2015-10-28: 750 mg via INTRAVENOUS
  Filled 2015-10-28: qty 150

## 2015-10-28 MED ORDER — FENTANYL CITRATE (PF) 100 MCG/2ML IJ SOLN: 100.0000 ug | INTRAMUSCULAR | Status: DC | PRN

## 2015-10-28 MED ORDER — POTASSIUM CHLORIDE 10 MEQ/100ML IV SOLN
10.0000 meq | INTRAVENOUS | Status: AC
  Filled 2015-10-28: qty 100

## 2015-10-28 MED ORDER — NOREPINEPHRINE BITARTRATE 1 MG/ML IV SOLN
0.0000 ug/min | INTRAVENOUS | Status: DC
  Administered 2015-10-28: 14 ug/min via INTRAVENOUS
  Filled 2015-10-28: qty 4

## 2015-10-28 MED ORDER — HEPARIN SOD (PORK) LOCK FLUSH 100 UNIT/ML IV SOLN
INTRAVENOUS | Status: AC
  Filled 2015-10-28: qty 5

## 2015-10-28 MED ORDER — ENSURE ENLIVE PO LIQD
237.0000 mL | Freq: Two times a day (BID) | ORAL | Status: DC
  Administered 2015-10-28: 237 mL via ORAL

## 2015-10-28 MED ORDER — FENTANYL CITRATE (PF) 100 MCG/2ML IJ SOLN
50.0000 ug | Freq: Once | INTRAMUSCULAR | Status: AC
  Administered 2015-10-28: 50 ug via INTRAVENOUS

## 2015-10-28 MED ORDER — FENTANYL CITRATE (PF) 2500 MCG/50ML IJ SOLN
INTRAMUSCULAR | Status: AC
  Filled 2015-10-28: qty 50

## 2015-10-28 MED ORDER — IOPAMIDOL (ISOVUE-370) INJECTION 76%
80.0000 mL | Freq: Once | INTRAVENOUS | Status: AC | PRN
Start: 2015-10-28 — End: 2015-10-28
  Administered 2015-10-28: 80 mL via INTRAVENOUS

## 2015-10-28 MED ORDER — ANTISEPTIC ORAL RINSE SOLUTION (CORINZ): 7.0000 mL | Freq: Four times a day (QID) | OROMUCOSAL | Status: DC

## 2015-10-28 MED ORDER — MIDAZOLAM HCL 2 MG/2ML IJ SOLN
2.0000 mg | INTRAMUSCULAR | Status: DC | PRN
Start: 2015-10-28 — End: 2015-10-28

## 2015-10-28 MED ORDER — FENTANYL CITRATE (PF) 100 MCG/2ML IJ SOLN: 50.0000 ug | Freq: Once | INTRAMUSCULAR | Status: DC

## 2015-10-28 MED ORDER — SODIUM BICARBONATE 8.4 % IV SOLN: 50.0000 meq | Freq: Once | INTRAVENOUS | Status: DC

## 2015-10-28 MED ORDER — LACTATED RINGERS IV SOLN
INTRAVENOUS | Status: DC
  Administered 2015-10-29: 01:00:00 via INTRAVENOUS

## 2015-10-28 MED ORDER — ROCURONIUM BROMIDE 100 MG/10ML IV SOLN
INTRAVENOUS | Status: DC | PRN
  Administered 2015-10-28: 50 mg via INTRAVENOUS

## 2015-10-28 MED ORDER — VASOPRESSIN 20 UNIT/ML IV SOLN
0.0300 [IU]/min | INTRAVENOUS | Status: AC
  Administered 2015-10-28: 0.03 [IU]/min via INTRAVENOUS
  Filled 2015-10-28: qty 2

## 2015-10-28 MED ORDER — CIPROFLOXACIN IN D5W 400 MG/200ML IV SOLN
400.0000 mg | INTRAVENOUS | Status: AC
  Administered 2015-10-28: 400 mg via INTRAVENOUS
  Filled 2015-10-28: qty 200

## 2015-10-28 MED ORDER — SODIUM CHLORIDE 0.9 % IV BOLUS (SEPSIS)
500.0000 mL | Freq: Once | INTRAVENOUS | Status: AC
  Administered 2015-10-28: 500 mL via INTRAVENOUS

## 2015-10-28 MED ORDER — ROCURONIUM BROMIDE 50 MG/5ML IV SOLN
INTRAVENOUS | Status: AC
  Filled 2015-10-28: qty 1

## 2015-10-28 MED ORDER — CHLORHEXIDINE GLUCONATE 0.12 % MT SOLN
15.0000 mL | Freq: Two times a day (BID) | OROMUCOSAL | Status: DC
  Administered 2015-10-28: 15 mL via OROMUCOSAL
  Filled 2015-10-28: qty 15

## 2015-10-28 MED ORDER — POTASSIUM CHLORIDE 2 MEQ/ML IV SOLN
INTRAVENOUS | Status: DC
  Administered 2015-10-28: 19:00:00 via INTRAVENOUS
  Filled 2015-10-28 (×3): qty 840

## 2015-10-28 MED ORDER — LEVOFLOXACIN IN D5W 750 MG/150ML IV SOLN: 750.0000 mg | INTRAVENOUS | Status: DC

## 2015-10-28 MED ORDER — HYDROMORPHONE HCL 1 MG/ML IJ SOLN
1.0000 mg | INTRAMUSCULAR | Status: DC | PRN
  Administered 2015-10-28: 1 mg via INTRAVENOUS
  Filled 2015-10-28: qty 1

## 2015-10-28 MED ORDER — METRONIDAZOLE IN NACL 5-0.79 MG/ML-% IV SOLN
500.0000 mg | Freq: Three times a day (TID) | INTRAVENOUS | Status: DC
Start: 2015-10-28 — End: 2015-10-29
  Administered 2015-10-28 – 2015-10-29 (×3): 500 mg via INTRAVENOUS
  Filled 2015-10-28 (×6): qty 100

## 2015-10-28 MED ORDER — 0.9 % SODIUM CHLORIDE (POUR BTL) OPTIME
TOPICAL | Status: DC | PRN
  Administered 2015-10-28: 1000 mL

## 2015-10-28 MED ORDER — SODIUM BICARBONATE 8.4 % IV SOLN
INTRAVENOUS | Status: AC
  Filled 2015-10-28: qty 100

## 2015-10-28 MED ORDER — ANTISEPTIC ORAL RINSE SOLUTION (CORINZ)
7.0000 mL | Freq: Four times a day (QID) | OROMUCOSAL | Status: DC
  Administered 2015-10-29 (×2): 7 mL via OROMUCOSAL

## 2015-10-28 MED ORDER — CHLORHEXIDINE GLUCONATE 0.12% ORAL RINSE (MEDLINE KIT)
15.0000 mL | Freq: Two times a day (BID) | OROMUCOSAL | Status: DC
  Administered 2015-10-29 (×2): 15 mL via OROMUCOSAL

## 2015-10-28 MED ORDER — SODIUM CHLORIDE 0.9 % IV SOLN
Freq: Once | INTRAVENOUS | Status: AC
  Administered 2015-10-28: 23:00:00 via INTRAVENOUS

## 2015-10-28 MED ORDER — SUGAMMADEX SODIUM 200 MG/2ML IV SOLN
INTRAVENOUS | Status: AC
  Filled 2015-10-28: qty 2

## 2015-10-28 MED ORDER — NOREPINEPHRINE BITARTRATE 1 MG/ML IV SOLN
2.0000 ug/min | INTRAVENOUS | Status: DC
  Administered 2015-10-29: 20 ug/min via INTRAVENOUS
  Filled 2015-10-28: qty 4

## 2015-10-28 MED ORDER — MORPHINE SULFATE (PF) 2 MG/ML IV SOLN: 2.0000 mg | INTRAVENOUS | Status: DC | PRN

## 2015-10-28 MED ORDER — SILVER SULFADIAZINE 1 % EX CREA
TOPICAL_CREAM | CUTANEOUS | Status: AC
  Filled 2015-10-28: qty 50

## 2015-10-28 MED ORDER — LIDOCAINE 2% (20 MG/ML) 5 ML SYRINGE
INTRAMUSCULAR | Status: AC
  Filled 2015-10-28: qty 5

## 2015-10-28 MED ORDER — MIDAZOLAM HCL 2 MG/2ML IJ SOLN
INTRAMUSCULAR | Status: AC
  Administered 2015-10-28: 2 mg via INTRAVENOUS
  Filled 2015-10-28: qty 2

## 2015-10-28 MED ORDER — SODIUM CHLORIDE 0.9 % IV SOLN
25.0000 ug/h | INTRAVENOUS | Status: DC
  Administered 2015-10-28: 50 ug/h via INTRAVENOUS
  Filled 2015-10-28: qty 50

## 2015-10-28 MED ORDER — FAMOTIDINE IN NACL 20-0.9 MG/50ML-% IV SOLN
20.0000 mg | Freq: Two times a day (BID) | INTRAVENOUS | Status: DC
  Administered 2015-10-29: 20 mg via INTRAVENOUS
  Filled 2015-10-28 (×3): qty 50

## 2015-10-28 MED ORDER — MIDAZOLAM HCL 2 MG/2ML IJ SOLN
2.0000 mg | INTRAMUSCULAR | Status: DC | PRN
  Administered 2015-10-28 – 2015-10-29 (×2): 2 mg via INTRAVENOUS
  Filled 2015-10-28 (×2): qty 2

## 2015-10-28 MED ORDER — CETYLPYRIDINIUM CHLORIDE 0.05 % MT LIQD
7.0000 mL | Freq: Two times a day (BID) | OROMUCOSAL | Status: DC
  Administered 2015-10-28 (×2): 7 mL via OROMUCOSAL

## 2015-10-28 MED ORDER — SUFENTANIL CITRATE 50 MCG/ML IV SOLN
INTRAVENOUS | Status: AC
  Filled 2015-10-28: qty 1

## 2015-10-28 SURGICAL SUPPLY — 46 items
BLADE SURG ROTATE 9660 (MISCELLANEOUS) IMPLANT
CANISTER SUCTION 2500CC (MISCELLANEOUS) ×2 IMPLANT
CANISTER WOUND CARE 500ML ATS (WOUND CARE) ×2 IMPLANT
CHLORAPREP W/TINT 26ML (MISCELLANEOUS) ×2 IMPLANT
COVER SURGICAL LIGHT HANDLE (MISCELLANEOUS) ×2 IMPLANT
DRAPE LAPAROSCOPIC ABDOMINAL (DRAPES) ×2 IMPLANT
DRAPE WARM FLUID 44X44 (DRAPE) ×2 IMPLANT
DRSG OPSITE POSTOP 4X10 (GAUZE/BANDAGES/DRESSINGS) IMPLANT
DRSG OPSITE POSTOP 4X8 (GAUZE/BANDAGES/DRESSINGS) IMPLANT
DURAPREP 26ML APPLICATOR (WOUND CARE) ×2 IMPLANT
ELECT BLADE 6.5 EXT (BLADE) IMPLANT
ELECT CAUTERY BLADE 6.4 (BLADE) IMPLANT
ELECT REM PT RETURN 9FT ADLT (ELECTROSURGICAL) ×2
ELECTRODE REM PT RTRN 9FT ADLT (ELECTROSURGICAL) ×1 IMPLANT
GLOVE BIO SURGEON STRL SZ7 (GLOVE) ×2 IMPLANT
GLOVE BIOGEL PI IND STRL 7.5 (GLOVE) ×1 IMPLANT
GLOVE BIOGEL PI INDICATOR 7.5 (GLOVE) ×1
GOWN STRL REUS W/ TWL LRG LVL3 (GOWN DISPOSABLE) ×2 IMPLANT
GOWN STRL REUS W/TWL LRG LVL3 (GOWN DISPOSABLE) ×2
KIT BASIN OR (CUSTOM PROCEDURE TRAY) ×2 IMPLANT
KIT ROOM TURNOVER OR (KITS) ×2 IMPLANT
LIGASURE IMPACT 36 18CM CVD LR (INSTRUMENTS) ×2 IMPLANT
NS IRRIG 1000ML POUR BTL (IV SOLUTION) ×4 IMPLANT
PACK GENERAL/GYN (CUSTOM PROCEDURE TRAY) ×2 IMPLANT
PAD ARMBOARD 7.5X6 YLW CONV (MISCELLANEOUS) ×2 IMPLANT
RELOAD PROXIMATE 75MM BLUE (ENDOMECHANICALS) ×2 IMPLANT
SPECIMEN JAR LARGE (MISCELLANEOUS) IMPLANT
SPONGE ABDOMINAL VAC ABTHERA (MISCELLANEOUS) ×2 IMPLANT
SPONGE LAP 18X18 X RAY DECT (DISPOSABLE) IMPLANT
STAPLER PROXIMATE 75MM BLUE (STAPLE) ×2 IMPLANT
STAPLER VISISTAT 35W (STAPLE) ×2 IMPLANT
SUCTION POOLE TIP (SUCTIONS) ×2 IMPLANT
SUT PDS AB 1 TP1 96 (SUTURE) ×4 IMPLANT
SUT SILK 2 0 (SUTURE) ×1
SUT SILK 2 0 SH CR/8 (SUTURE) ×2 IMPLANT
SUT SILK 2 0 TIES 10X30 (SUTURE) ×2 IMPLANT
SUT SILK 2-0 18XBRD TIE 12 (SUTURE) ×1 IMPLANT
SUT SILK 3 0 (SUTURE) ×1
SUT SILK 3 0 SH CR/8 (SUTURE) ×2 IMPLANT
SUT SILK 3 0 TIES 10X30 (SUTURE) ×2 IMPLANT
SUT SILK 3-0 18XBRD TIE 12 (SUTURE) ×1 IMPLANT
SUT VIC AB 3-0 SH 27 (SUTURE)
SUT VIC AB 3-0 SH 27X BRD (SUTURE) IMPLANT
TOWEL OR 17X26 10 PK STRL BLUE (TOWEL DISPOSABLE) ×2 IMPLANT
TRAY FOLEY CATH 16FRSI W/METER (SET/KITS/TRAYS/PACK) ×2 IMPLANT
YANKAUER SUCT BULB TIP NO VENT (SUCTIONS) IMPLANT

## 2015-10-28 NOTE — Consult Note (Signed)
WOC wound consult note Reason for Consult:Dermatitis from radiation exposure to inguinal folds and perineal area.  Have been applying Silvadene cream.  Patient is in pain this AM in this area.  Epithelium is peeling and erythematous.  Serous weeping noted.  Wound type:Radiation burns.  Pressure Ulcer POA: N/A Measurement:4 cm x 2 cm x 0.1 cm peeling epithelium to inguinal and perineal area bilaterally.  Wound [id number] and moist Drainage (amount, consistency, odor) Scant serous weeping Periwound:Erythema and tenderness Dressing procedure/placement/frequency:Cleanse inguinal folds and perineal area with soap and water and pat gently dry.  Interdry Ag silver skin fold management system.  Measure and cut length of InterDry Ag+ to fit in skin folds that have skin breakdown Tuck InterDry  Ag+ fabric into skin folds in a single layer, allow for 2 inches of overhang from skin edges to allow for wicking to occur May remove to bathe; dry area thoroughly and then tuck into affected areas again Do not apply any creams or ointments when using InterDry Ag+ DO NOT THROW AWAY FOR 5 DAYS unless soiled with stool DO NOT [facility] product, this will inactivate the silver in the material  New sheet of Interdry Ag+ should be applied after 5 days of use if patient continues to have skin breakdown   Will not follow at this time.  Please re-consult if needed.  Domenic Moras RN BSN [place] Pager [phone]
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
## 2015-10-28 NOTE — Progress Notes (Addendum)
[facility] Progress Note Patient Name: Jillian Estrada DOB: [birth year] MRN: [id number]   Date of Service  10/25/2015  HPI/Events of Note  Pt admitted to [facility] with ARF, inguinal/ perineal pain. Intubated today for resp distress after code blue called on floor, worsening abd pain, N/V  CT scan > SBO. ABG > Severe met acidosis  eICU Interventions  Pt to be transferred to [facility] for surgery eval. LA and repeat labs pending. Continue levaquin, add flagyl 500 cc IVF bolus for low BP. Bicarb drip     Intervention Category Evaluation Type: New Patient Evaluation  Jillian Estrada 10/17/2015, 6:02 PM

## 2015-10-28 NOTE — Progress Notes (Signed)
Change in patient's level of consciousness. Pt's daughter notified.

## 2015-10-28 NOTE — Progress Notes (Signed)
Placed the patient's NGT in per order.  Patient put out dark color secretions a small amount initially.  Notified Dr. Roderic Palau of the placement of the tube and the amount of the secretion. He stated that the tube probably needed to be further down.  So I went back to readjust the tubing and the patient was unresponsive and her pupils was dilated.  Dr. Roderic Palau was notified and he came at that time in the room.  Code blue was called.  The patient was transferred to the ICU and Deno Etienne was given report.  The family was also updated.

## 2015-10-28 NOTE — Progress Notes (Signed)
CRITICAL VALUE ALERT  Critical value received:  Lactic Acid 11  Date of notification: 11/04/2015  Time of notification:  [id number]  Critical value read back:yes  Nurse who received alert:  E.Dquan Cortopassi RN  MD notified (1st page):  Dr. Roderic Palau   Time of first page:  Dr. Roderic Palau  MD notified (2nd page):  Time of second page:  Responding MD: Dr. Roderic Palau  Time MD responded:  [phone]

## 2015-10-28 NOTE — ED Provider Notes (Signed)
I was called to this patient's room due to a code blue Pt was unresponsive, she was in respiratory distress Per hospitalist ,dr Roderic Palau, pt has had worsening abdominal pain/vomiting and became unresponsive On my arrival, pt was unresponsive but she was not pulseless She was hypotensive and also hypoxic She was noted to have copious vomit in her mouth on my arrival to the room Her mouth was suctioned copiously First attempt to intubate with direct laryngoscopy was unsuccessful I was then able to intubate with glidescope with assistance with bougie  INTUBATION Performed by: Sharyon Cable  Timeout was not called due to emergent situation  Indications: unresponsive, respiratory distress  Intubation method: Glidescope Laryngoscopy   Preoxygenation: BVM  No medications  Tube Size: 7.5 cuffed  Post-procedure assessment: chest rise and ETCO2 monitor Breath sounds: equal and absent over the epigastrium Tube secured with: ETT holder   Chest x-ray findings: endotracheal tube in appropriate position  Patient tolerated the procedure well with no immediate complications.      Ripley Fraise, MD 10/14/2015 [phone]

## 2015-10-28 NOTE — Op Note (Signed)
Preoperative diagnosis: ischemic bowel Postoperative diagnosis: same as above Procedure: exploratory laparotomy, small bowel resection, abdominal vac placement Surgeon: Dr Serita Grammes  Anesthesia General EBL minimal Complications none Drains none Specimens: small bowel short stitch superior Sponge and needle count correct Dispo to recovery stable  Indications: this is a 33 yof who is undergoing chemo/radiation for anal scc. She was admitted to aph and then eventually underwent ct scan for abdominal distention and pain that showed likely ischemic bowel and possible sbo.  She was intubated there for respiratory arrest. Lactate was 11. Had prolonged transfer.  She arrives here and I discussed with family proceeding to OR for elap likely sbr.  Procedure: After informed consent was obtained patient was taken to the OR. She was already intubated and was placed under general anesthesia without complication. She was given antibiotics and had scds in place.  She had arterial line and central line placed by anesthesia.  She was prepped and draped in the standard sterile surgical fashion. A timeout was performed.  I made a midline incision.  I entered the peritoneal cavity superiorly.  I then extended this inferiorly. There were some omental adhesions that were taken down.  There was a fair amount of murky fluid in the pelvis.  I then eviscerated the small bowel.  There was a fairly long segment from the proximal jejunum to the distal jejunum that was ischemic and dead.  I used the gia stapler to divide this and then the ligasure device to divide the mesentery.  I oversewed a bleeder. I marked the proximal end of the bowel in discontinuity with a single silk stitch and the distal end with a double stitch. There was some patchy necrosis throughout the remaining bowel but it was mostly viable. It did appear there was a transition point and I had released some adhesions in the pelvis so this may be source. She  was hypotensive during the case so evaluation of arterial flow was difficult.  I irrigated. I then elected to leave her open as she will need reevaluation and may need more bowel resected.  I placed a vac and this was functional. She was then transferred back to icu in critical condition

## 2015-10-28 NOTE — Progress Notes (Signed)
Care link notified me that the patients room would be 2 mid [place].  Report to be called to [phone].  Called and spoke to [place] on the unit and she asked for to report to be called at 1900.

## 2015-10-29 ENCOUNTER — Inpatient Hospital Stay (HOSPITAL_COMMUNITY): Payer: Medicare Other

## 2015-10-29 ENCOUNTER — Encounter (HOSPITAL_COMMUNITY): Payer: Self-pay | Admitting: General Surgery

## 2015-10-29 DIAGNOSIS — R000 Tachycardia, unspecified: Secondary | ICD-10-CM

## 2015-10-29 DIAGNOSIS — N179 Acute kidney failure, unspecified: Secondary | ICD-10-CM

## 2015-10-29 DIAGNOSIS — K559 Vascular disorder of intestine, unspecified: Secondary | ICD-10-CM

## 2015-10-29 DIAGNOSIS — J9601 Acute respiratory failure with hypoxia: Secondary | ICD-10-CM

## 2015-10-29 DIAGNOSIS — J9600 Acute respiratory failure, unspecified whether with hypoxia or hypercapnia: Secondary | ICD-10-CM

## 2015-10-29 DIAGNOSIS — E872 Acidosis: Secondary | ICD-10-CM

## 2015-10-29 LAB — ECHOCARDIOGRAM COMPLETE
CHL CUP MV DEC (S): 201
E/e' ratio: 5.23
EWDT: 201 ms
FS: 42 % (ref 28–44)
HEIGHTINCHES: 64 in
IV/PV OW: 1.24
LA ID, A-P, ES: 27 mm
LA diam end sys: 27 mm
LA vol A4C: 17.8 ml
LA vol index: 9.4 mL/m2
LA vol: 16.5 mL
LADIAMINDEX: 1.53 cm/m2
LV E/e' medial: 5.23
LV E/e'average: 5.23
LV dias vol index: 13 mL/m2
LV e' LATERAL: 11.1 cm/s
LVDIAVOL: 22 mL — AB (ref 46–106)
LVOT SV: 39 mL
LVOT VTI: 12.5 cm
LVOT area: 3.14 cm2
LVOT peak vel: 81.7 cm/s
LVOTD: 20 mm
Lateral S' vel: 19.9 cm/s
MVPKAVEL: 70 m/s
MVPKEVEL: 58 m/s
PW: 9.06 mm — AB (ref 0.6–1.1)
RV TAPSE: 17.4 mm
TDI e' lateral: 11.1
TDI e' medial: 11.2
WEIGHTICAEL: 2391.55 [oz_av]

## 2015-10-29 LAB — MRSA PCR SCREENING: MRSA BY PCR: NEGATIVE

## 2015-10-29 LAB — BLOOD GAS, ARTERIAL
Acid-base deficit: 4.8 mmol/L — ABNORMAL HIGH (ref 0.0–2.0)
Bicarbonate: 19.4 mEq/L — ABNORMAL LOW (ref 20.0–24.0)
DRAWN BY: 10006
FIO2: 0.7
LHR: 24 {breaths}/min
MECHVT: 440 mL
O2 SAT: 91 %
PEEP/CPAP: 5 cmH2O
PH ART: 7.383 (ref 7.350–7.450)
Patient temperature: 98.1
TCO2: 20.5 mmol/L (ref 0–100)
pCO2 arterial: 33.3 mmHg — ABNORMAL LOW (ref 35.0–45.0)
pO2, Arterial: 65 mmHg — ABNORMAL LOW (ref 80.0–100.0)

## 2015-10-29 LAB — URINE CULTURE

## 2015-10-29 LAB — GLUCOSE, CAPILLARY: Glucose-Capillary: 195 mg/dL — ABNORMAL HIGH (ref 65–99)

## 2015-10-29 LAB — BASIC METABOLIC PANEL
ANION GAP: 10 (ref 5–15)
BUN: 78 mg/dL — ABNORMAL HIGH (ref 6–20)
CALCIUM: 6.7 mg/dL — AB (ref 8.9–10.3)
CO2: 20 mmol/L — ABNORMAL LOW (ref 22–32)
Chloride: 107 mmol/L (ref 101–111)
Creatinine, Ser: 2.1 mg/dL — ABNORMAL HIGH (ref 0.44–1.00)
GFR calc Af Amer: 28 mL/min — ABNORMAL LOW (ref >60)
GFR, EST NON AFRICAN AMERICAN: 24 mL/min — AB (ref >60)
Glucose, Bld: 98 mg/dL (ref 65–99)
Potassium: 4.9 mmol/L (ref 3.5–5.1)
SODIUM: 137 mmol/L (ref 135–145)

## 2015-10-29 LAB — CBC
HCT: 32.4 % — ABNORMAL LOW (ref 36.0–46.0)
HEMATOCRIT: 27.4 % — AB (ref 36.0–46.0)
Hemoglobin: 10.9 g/dL — ABNORMAL LOW (ref 12.0–15.0)
Hemoglobin: 9.3 g/dL — ABNORMAL LOW (ref 12.0–15.0)
MCH: 30.8 pg (ref 26.0–34.0)
MCH: 31 pg (ref 26.0–34.0)
MCHC: 33.6 g/dL (ref 30.0–36.0)
MCHC: 33.9 g/dL (ref 30.0–36.0)
MCV: 91.5 fL (ref 78.0–100.0)
MCV: 91.3 fL (ref 78.0–100.0)
PLATELETS: 10 10*3/uL — AB (ref 150–400)
Platelets: 102 10*3/uL — ABNORMAL LOW (ref 150–400)
RBC: 3.54 MIL/uL — AB (ref 3.87–5.11)
RBC: 3 MIL/uL — AB (ref 3.87–5.11)
RDW: 16.7 % — ABNORMAL HIGH (ref 11.5–15.5)
RDW: 16.6 % — AB (ref 11.5–15.5)
WBC: 5.5 10*3/uL (ref 4.0–10.5)
WBC: 7.7 10*3/uL (ref 4.0–10.5)

## 2015-10-29 LAB — POCT I-STAT 7, (LYTES, BLD GAS, ICA,H+H)
Acid-base deficit: 8 mmol/L — ABNORMAL HIGH (ref 0.0–2.0)
BICARBONATE: 17.3 meq/L — AB (ref 20.0–24.0)
CALCIUM ION: 0.94 mmol/L — AB (ref 1.13–1.30)
HCT: 22 % — ABNORMAL LOW (ref 36.0–46.0)
Hemoglobin: 7.5 g/dL — ABNORMAL LOW (ref 12.0–15.0)
O2 SAT: 97 %
PCO2 ART: 30.5 mmHg — AB (ref 35.0–45.0)
PH ART: 7.353 (ref 7.350–7.450)
PO2 ART: 90 mmHg (ref 80.0–100.0)
Potassium: 3.5 mmol/L (ref 3.5–5.1)
Sodium: 136 mmol/L (ref 135–145)
TCO2: 18 mmol/L (ref 0–100)

## 2015-10-29 LAB — POCT I-STAT 3, ART BLOOD GAS (G3+)
Acid-base deficit: 21 mmol/L — ABNORMAL HIGH (ref 0.0–2.0)
Bicarbonate: 8 mEq/L — ABNORMAL LOW (ref 20.0–24.0)
O2 SAT: 87 %
PCO2 ART: 29.4 mmHg — AB (ref 35.0–45.0)
PO2 ART: 76 mmHg — AB (ref 80.0–100.0)
Patient temperature: 98.9
TCO2: 9 mmol/L (ref 0–100)
pH, Arterial: 7.043 — CL (ref 7.350–7.450)

## 2015-10-29 LAB — HEMOGLOBIN A1C
HEMOGLOBIN A1C: 5.8 % — AB (ref 4.8–5.6)
MEAN PLASMA GLUCOSE: 120 mg/dL

## 2015-10-29 LAB — CORTISOL: Cortisol, Plasma: 89.9 ug/dL

## 2015-10-29 MED ORDER — MORPHINE BOLUS VIA INFUSION
5.0000 mg | INTRAVENOUS | Status: DC | PRN
  Administered 2015-10-29: 10 mg via INTRAVENOUS
  Administered 2015-10-29: 15 mg via INTRAVENOUS
  Filled 2015-10-29 (×3): qty 20

## 2015-10-29 MED ORDER — SODIUM BICARBONATE 8.4 % IV SOLN
50.0000 meq | Freq: Once | INTRAVENOUS | Status: AC
  Administered 2015-10-29: 50 meq via INTRAVENOUS
  Filled 2015-10-29: qty 50

## 2015-10-29 MED ORDER — SODIUM BICARBONATE 8.4 % IV SOLN
INTRAVENOUS | Status: DC
  Filled 2015-10-29 (×4): qty 150

## 2015-10-29 MED ORDER — PERFLUTREN LIPID MICROSPHERE
2.0000 mL | Freq: Once | INTRAVENOUS | Status: AC
  Administered 2015-10-29: 2 mL via INTRAVENOUS
  Filled 2015-10-29: qty 10

## 2015-10-29 MED ORDER — LORAZEPAM 2 MG/ML IJ SOLN
0.5000 mg | INTRAMUSCULAR | Status: DC | PRN
Start: 2015-10-29 — End: 2015-10-29

## 2015-10-29 MED ORDER — HEPARIN SOD (PORK) LOCK FLUSH 100 UNIT/ML IV SOLN
500.0000 [IU] | INTRAVENOUS | Status: DC | PRN
  Filled 2015-10-29: qty 5

## 2015-10-29 MED ORDER — HEPARIN SOD (PORK) LOCK FLUSH 100 UNIT/ML IV SOLN
500.0000 [IU] | INTRAVENOUS | Status: DC
Start: 2015-10-29 — End: 2015-10-29
  Filled 2015-10-29: qty 5

## 2015-10-29 MED ORDER — FAMOTIDINE IN NACL 20-0.9 MG/50ML-% IV SOLN
20.0000 mg | INTRAVENOUS | Status: DC
  Filled 2015-10-29: qty 50

## 2015-10-29 MED ORDER — POTASSIUM CHLORIDE 10 MEQ/100ML IV SOLN
10.0000 meq | INTRAVENOUS | Status: AC
  Administered 2015-10-29 (×4): 10 meq via INTRAVENOUS
  Filled 2015-10-29 (×4): qty 100

## 2015-10-29 MED ORDER — MORPHINE SULFATE 25 MG/ML IV SOLN
10.0000 mg/h | INTRAVENOUS | Status: DC
  Administered 2015-10-29: 10 mg/h via INTRAVENOUS
  Filled 2015-10-29: qty 10

## 2015-10-29 MED ORDER — HYDROCORTISONE NA SUCCINATE PF 100 MG IJ SOLR
50.0000 mg | Freq: Four times a day (QID) | INTRAMUSCULAR | Status: DC
  Administered 2015-10-29: 50 mg via INTRAVENOUS
  Filled 2015-10-29: qty 1
  Filled 2015-10-29: qty 2

## 2015-10-29 MED ORDER — SODIUM CHLORIDE 0.9 % IV SOLN
INTRAVENOUS | Status: DC
  Administered 2015-10-29: 100 mL/h via INTRAVENOUS

## 2015-10-30 ENCOUNTER — Other Ambulatory Visit (HOSPITAL_COMMUNITY): Payer: Self-pay | Admitting: Emergency Medicine

## 2015-10-30 LAB — PREPARE PLATELET PHERESIS
Unit division: 0
Unit division: 0

## 2015-10-31 ENCOUNTER — Telehealth: Payer: Self-pay

## 2015-10-31 ENCOUNTER — Encounter: Payer: Self-pay | Admitting: Internal Medicine

## 2015-10-31 NOTE — Telephone Encounter (Signed)
On 10/31/2015 I received a death certificate from [place] (faxed). The death certificate is for cremation. The patient is a patient of Doctor Nelda Marseille. The death certificate will be taken to [facility] (2100) this pm for signature. On November 03, 2015 I received the death certificate back from Doctor Nelda Marseille. I got the death certificate ready and faxed the death certificate over to the funeral home per the funeral home request.

## 2015-11-04 ENCOUNTER — Telehealth: Payer: Self-pay

## 2015-11-04 MED FILL — Medication: Qty: 1 | Status: AC

## 2015-11-04 NOTE — Telephone Encounter (Signed)
On 06/26/2017I received a death certificate from [place] (original). The death certificate is for cremation. The patient is a patient of Doctor Nelda Marseille. The death certificate will be taken to [facility] (2100) for signature. On 04-Dec-2015 I received the death certificate back from Doctor Nelda Marseille. I got the death certificate ready and mailed the death certificate to the [facility] Dept per the funeral home request.

## 2015-11-09 NOTE — Care Management Note (Signed)
Case Management Note  Patient Details  Name: Jillian Estrada MRN: [id number] Date of Birth: 07-Oct-1955  Subjective/Objective:    Pt admitted with rectal cancer - s/p exploratory laparotomy, small bowel resection, abdominal vac placement          Action/Plan:  Pt is from with husband.  Tentative abdominal surgery planned for tomorrow - may require comfort care per surgery.  CM will continue to follow for discharge needs   Expected Discharge Date:                  Expected Discharge Plan:  [place]  In-House Referral:     Discharge planning Services  CM Consult  Post Acute Care Choice:    Choice offered to:     DME Arranged:    DME Agency:     HH Arranged:    HH Agency:     Status of Service:  In process, will continue to follow  If discussed at Long Length of Stay Meetings, dates discussed:    Additional Comments:  Maryclare Labrador, RN 11-01-2015, 12:19 PM

## 2015-11-09 NOTE — Progress Notes (Addendum)
Called by RT with a repeat ABG with pH of 7.045 with refractory metabolic acidosis.  Spoke with daughter and husband, informed them that she is unlikely to survive today with such acidosis and concern for dead gut.  Called renal for ?dialysis for refractory acidosis and [facility] for ?surgical need in this patient.  Renal will see but if not a surgical candidate then there is no indication for dialsys and surgery called back, patient is not a surgical candidate so only thing to do is comfort care at this point.  Family is on their way in but want full support for now.  Called family back, informed that there is nothing left for [place] to do and that we will make patient a full DNR and will start morphine and terminally extubate when they are ready.  The patient is critically ill with multiple organ systems failure and requires high complexity decision making for assessment and support, frequent evaluation and titration of therapies, application of advanced monitoring technologies and extensive interpretation of multiple databases.   Critical Care Time devoted to patient care services described in this note is  60  Minutes. This time reflects time of care of this signee Dr Jennet Maduro. This critical care time does not reflect procedure time, or teaching time or supervisory time of PA/NP/Med student/Med Resident etc but could involve care discussion time.  Rush Farmer, M.D. [facility]/Critical [facility] Medicine. Pager: [phone]. After hours pager: [phone].

## 2015-11-09 NOTE — Transfer of Care (Signed)
Immediate Anesthesia Transfer of Care Note  Patient: Jillian Estrada  Procedure(s) Performed: Procedure(s): EXPLORATORY LAPAROTOMY, SMALL BOWEL RESECTION (N/A)  Patient Location: SICU  Anesthesia Type:General  Level of Consciousness: Patient remains intubated per anesthesia plan  Airway & Oxygen Therapy: Patient remains intubated per anesthesia plan and Patient placed on Ventilator (see vital sign flow sheet for setting)  Post-op Assessment: Report given to RN and Post -op Vital signs reviewed and stable  Post vital signs: Reviewed and stable  Last Vitals:  Filed Vitals:   10/13/2015 2215 11/05/2015 2230  BP: 112/86 93/70  Pulse: 118 116  Temp: 36.8 C 36.9 C  Resp: 27 25    Last Pain:  Filed Vitals:   10/28/15 2255  PainSc: 7       Patients Stated Pain Goal: 2 ([id number])  Complications: No apparent anesthesia complications

## 2015-11-09 NOTE — Procedures (Signed)
Extubation Procedure Note  Patient Details:   Name: Jillian Estrada DOB: 1955-07-26 MRN: [id number]   Airway Documentation:  Airway 7.5 mm (Active)  Secured at (cm) 22 cm 11-25-15  3:35 PM  Measured From Lips November 25, 2015  3:35 PM  [place] 11/25/2015  3:35 PM  Secured By [employer] November 25, 2015  3:35 PM  Tube Holder Repositioned Yes Nov 25, 2015  3:35 PM  Cuff Pressure (cm H2O) 21 cm H2O 2015-11-25 12:32 PM  Site Condition Dry 25-Nov-2015  3:35 PM    Evaluation  O2 sats: transiently fell during during procedure Complications: Planned terminal extubation   Patient did not tolerate procedure well. Bilateral Breath Sounds: Clear, Diminished   No   Pt extubated to Room Air under comfort care. No stridor noted. Pt treated with morphine. Agonal respirations noted.   Tavone Caesar A Finley Chevez 25-Nov-2015, 5:15 PM

## 2015-11-09 NOTE — Progress Notes (Signed)
Patient ID: Jillian Estrada, female   DOB: 1955-06-04, 60 y.o.   MRN: [id number]   The patient was noted to be less responsive and increased respiratory issues.  PH noted at 7.045, hypotensive, requiring levophed.  supra therapeutic INR, shock liver, likely has more ischemic bowel as well.  She is too unstable for surgery and likely would not survive an operation.  Agree with comfort care measures.  Please do not hesitate to contact [facility] for further assistance.  Oather Muilenburg, ANP-BC

## 2015-11-09 NOTE — Progress Notes (Signed)
   November 19, 2015 1600  Clinical Encounter Type  Visited With Patient;Family  Visit Type Patient actively dying  Referral From Nurse  Stress Factors  Family Stress Factors Loss  Chaplain responded to end-of-life call. Patient was unresponsive and about to be extubated, husband and daughter were in waiting room and chose not to come back for the end. Husband had had several heart attacks and wished not to talk about what was happening. Daughter wished to stay with him. Chaplain prayed with patient and stayed with her until relieved by on-call chaplain. Zackaria Burkey, Chaplain

## 2015-11-09 NOTE — Final Progress Note (Signed)
Patient asystole at 1709 hours. Withdrawal of care/ comfort care measures initiated on patient. Patient was placed on a morphine gtt and ETT removed per RRT.  No spontaneous respirations, heart tones , or pupillary reaction noted.  Family to notified of patients TOD.

## 2015-11-09 NOTE — Progress Notes (Signed)
Fentanyl gtt, 120 mls wasted in sink.  Morphine gtt 220 mls wasted in sink.  Waste witnessed by Wynn Banker, RN

## 2015-11-09 NOTE — Anesthesia Procedure Notes (Signed)
Central Venous Catheter Insertion Performed by: anesthesiologist Patient location: Pre-op. Preanesthetic checklist: patient identified, IV checked, site marked, risks and benefits discussed, surgical consent, monitors and equipment checked, pre-op evaluation, timeout performed and anesthesia consent Position: Trendelenburg Lidocaine 1% used for infiltration Landmarks identified and Seldinger technique used Catheter size: 8 Fr Central line was placed.Double lumen Procedure performed using ultrasound guided technique. Attempts: 1 Following insertion, dressing applied, line sutured and Biopatch. Post procedure assessment: blood return through all ports. Patient tolerated the procedure well with no immediate complications.       

## 2015-11-09 NOTE — Progress Notes (Signed)
   11-01-2015 1700  Clinical Encounter Type  Visited With Patient;Health care provider  Visit Type Psychological support;Spiritual support;Social support;Patient actively dying  Referral From Nurse  Spiritual Encounters  Spiritual Needs Emotional  Stress Factors  Patient Stress Factors None identified  Family Stress Factors None identified   Chaplain was called to an end of life case, family didn't want to be in the room so the chaplain stay with Pt. Until time of death.

## 2015-11-09 NOTE — Anesthesia Preprocedure Evaluation (Addendum)
Anesthesia Evaluation  Patient identified by MRN, date of birth, ID band Patient unresponsive    Reviewed: Patient's Chart, lab work & pertinent test results, Unable to perform ROS - Chart review onlyPreop documentation limited or incomplete due to emergent nature of procedure.  Airway Mallampati: Intubated       Dental   Pulmonary    breath sounds clear to auscultation       Cardiovascular hypertension,  Rhythm:Regular Rate:Normal     Neuro/Psych    GI/Hepatic   Endo/Other    Renal/GU      Musculoskeletal   Abdominal   Peds  Hematology   Anesthesia Other Findings   Reproductive/Obstetrics                            Anesthesia Physical Anesthesia Plan  ASA: IV and emergent  Anesthesia Plan: General   Post-op Pain Management:    Induction: Inhalational  Airway Management Planned: Oral ETT  Additional Equipment: Arterial line, CVP and Ultrasound Guidance Line Placement  Intra-op Plan:   Post-operative Plan: Post-operative intubation/ventilation  Informed Consent: I have reviewed the patients History and Physical, chart, labs and discussed the procedure including the risks, benefits and alternatives for the proposed anesthesia with the patient or authorized representative who has indicated his/her understanding and acceptance.     Plan Discussed with: CRNA and Surgeon  Anesthesia Plan Comments:        Anesthesia Quick Evaluation
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
## 2015-11-09 NOTE — Progress Notes (Signed)
ABG was obtained on patient due to patient starting to look air hungry.  Results given to MD and MD made changes on ventilator.  Will continue to monitor.    Ref. Range 2015-11-14 14:42  pH, Arterial Latest Ref Range: 7.350-7.450  7.043 (LL)  pCO2 arterial Latest Ref Range: 35.0-45.0 mmHg 29.4 (L)  pO2, Arterial Latest Ref Range: 80.0-100.0 mmHg 76.0 (L)  Bicarbonate Latest Ref Range: 20.0-24.0 mEq/L 8.0 (L)  TCO2 Latest Ref Range: 0-100 mmol/L 9  Acid-base deficit Latest Ref Range: 0.0-2.0 mmol/L 21.0 (H)  O2 Saturation Latest Units: % 87.0  Patient temperature Unknown 98.9 F  Collection site Unknown ARTERIAL LINE

## 2015-11-09 NOTE — Anesthesia Postprocedure Evaluation (Signed)
Anesthesia Post Note  Patient: Jillian Estrada  Procedure(s) Performed: Procedure(s) (LRB): EXPLORATORY LAPAROTOMY, SMALL BOWEL RESECTION (N/A)  Patient location during evaluation: SICU Anesthesia Type: General Level of consciousness: sedated Pain management: pain level controlled Vital Signs Assessment: post-procedure vital signs reviewed and stable Respiratory status: patient remains intubated per anesthesia plan Cardiovascular status: stable Anesthetic complications: no    Last Vitals:  Filed Vitals:   11/13/2015 0408 Nov 13, 2015 0415  BP:  120/85  Pulse:  112  Temp: 36.7 C   Resp:  21    Last Pain:  Filed Vitals:   11-13-15 0422  PainSc: 7                  Tiajuana Amass

## 2015-11-09 NOTE — Progress Notes (Signed)
CRITICAL VALUE ALERT  Critical value received:  Platelets 10  Date of notification:  11-09-2015  Time of notification:  0036  Critical value read back: Yes  Nurse who received alert:  Deboraha Sprang, RN  MD notified (1st page):  Dr. Halford Chessman  Time of first page:  [phone]

## 2015-11-09 NOTE — Progress Notes (Signed)
  Echocardiogram 2D Echocardiogram has been performed.  Jillian Estrada 2015/11/09, 11:34 AM

## 2015-11-09 DEATH — deceased

## 2015-11-11 ENCOUNTER — Ambulatory Visit (HOSPITAL_COMMUNITY): Payer: Self-pay | Admitting: Hematology & Oncology

## 2016-01-15 ENCOUNTER — Encounter (HOSPITAL_COMMUNITY): Payer: Self-pay

## 2016-04-18 ENCOUNTER — Other Ambulatory Visit: Payer: Self-pay | Admitting: Nurse Practitioner

## 2017-02-18 IMAGING — CT CT ANGIO CHEST
4 of 7 series · 16 of 32 positions shown · IV contrast (ISOVUE)
Comparison: PET CT 09/02/2015

CLINICAL DATA: Tachycardia.

EXAM:
CT ANGIOGRAPHY CHEST
CT ABDOMEN AND PELVIS WITH CONTRAST
TECHNIQUE: Multidetector CT imaging of the chest was performed using the
standard protocol during bolus administration of intravenous
contrast. Multiplanar CT image reconstructions and MIPs were
obtained to evaluate the vascular anatomy. Multidetector CT imaging
of the abdomen and pelvis was performed using the standard protocol
during bolus administration of intravenous contrast.
CONTRAST:  80 cc Isovue 370 IV

[Series 4: pe 2.0 · axial · 0.67mm/px · z∈[-198,-78]mm · 3 of 121 slices shown]
[im 31/121  lung]
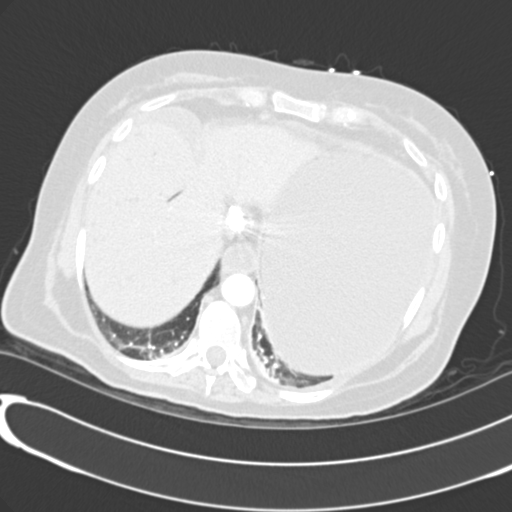
[im 61/121  lung]
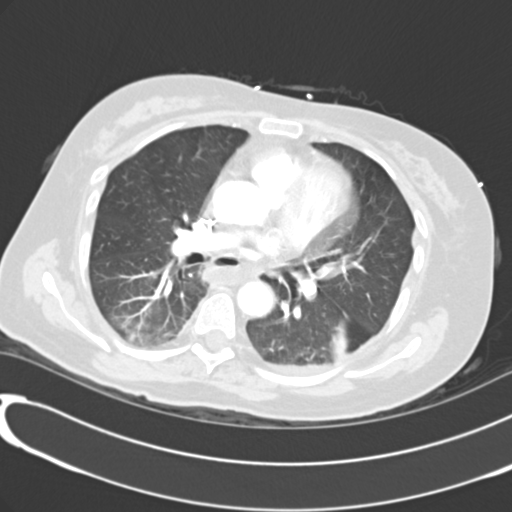
[im 91/121  lung]
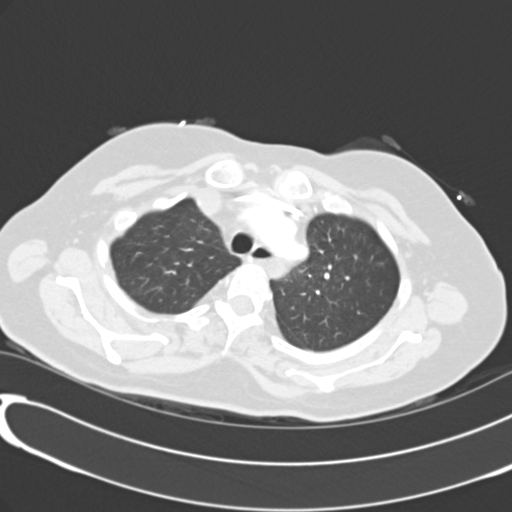

[Series 5: pe thins 1.5 · axial · 0.68mm/px · z∈[-228,-48]mm · 7 of 201 slices shown]
[im 26/201  lung]
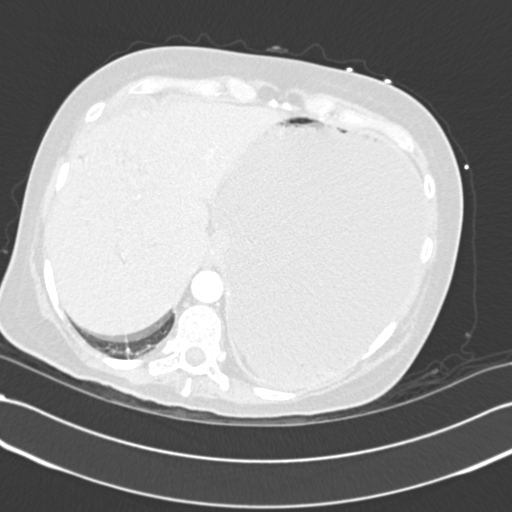
[im 51/201  soft-tissue]
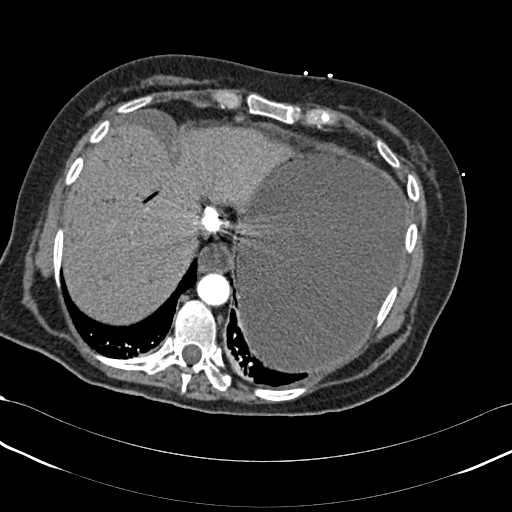
[im 76/201  lung]
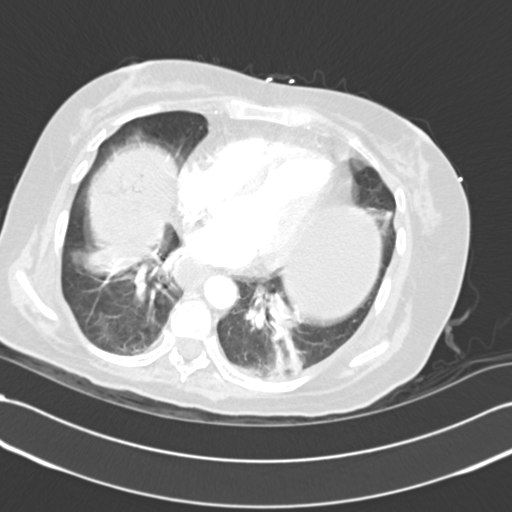
[im 101/201  soft-tissue]
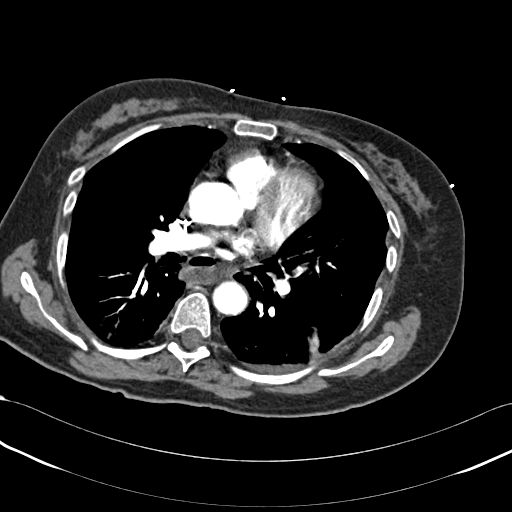
[im 126/201  lung]
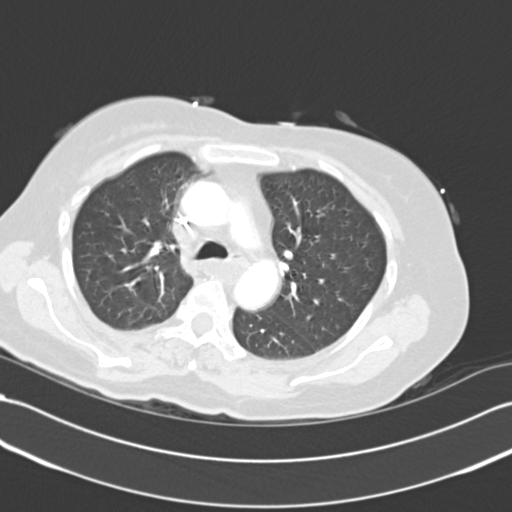
[im 151/201  soft-tissue]
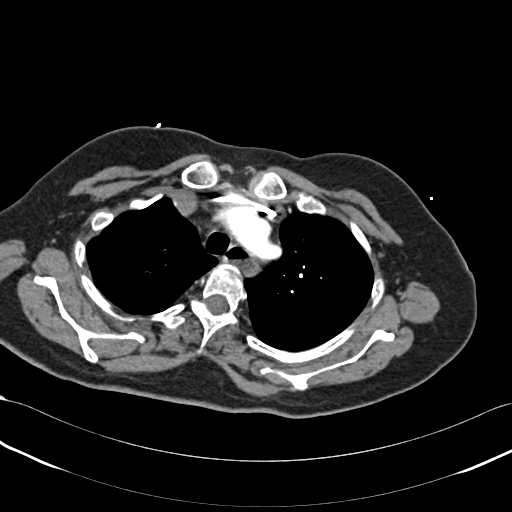
[im 176/201  lung]
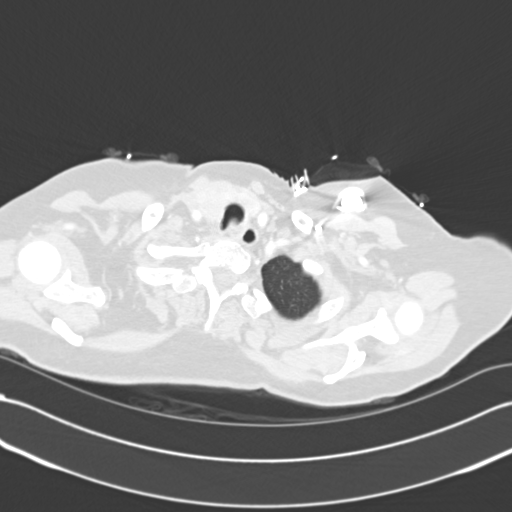

[Series 8: routine abd pel with · axial · 0.74mm/px · z∈[-518,-268]mm · 3 of 101 slices shown]
[im 26/101  lung]
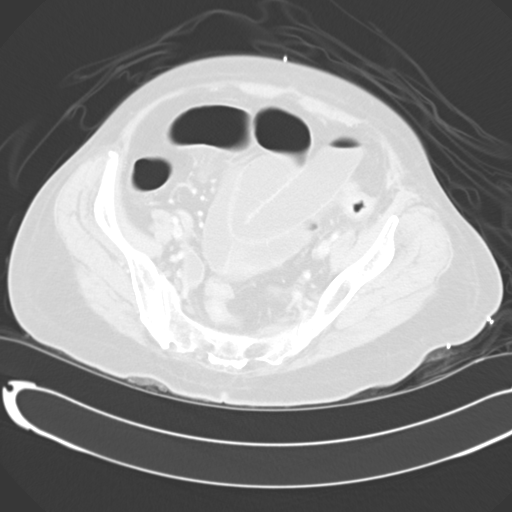
[im 51/101  lung]
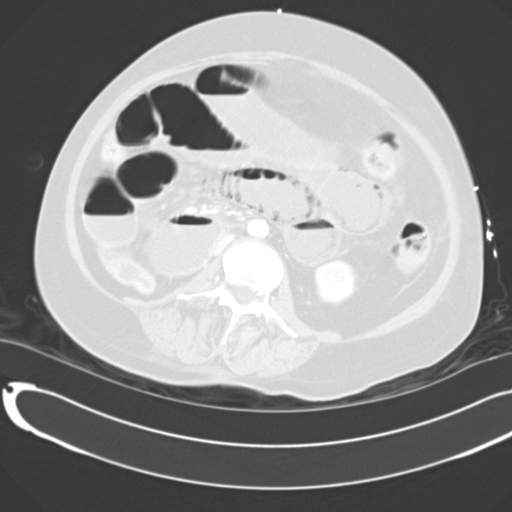
[im 76/101  lung]
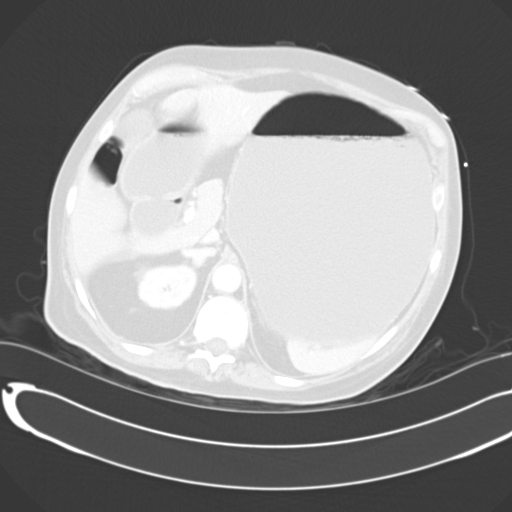

[Series 12: portal thins · axial · portal-venous · 0.74mm/px · z∈[-232,-172]mm · 3 of 101 slices shown]
[im 26/101  lung]
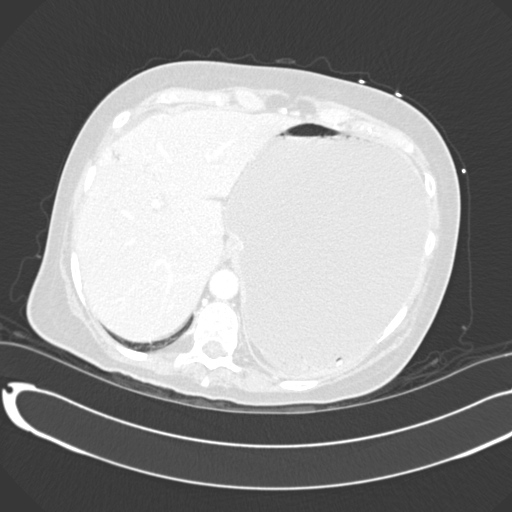
[im 51/101  lung]
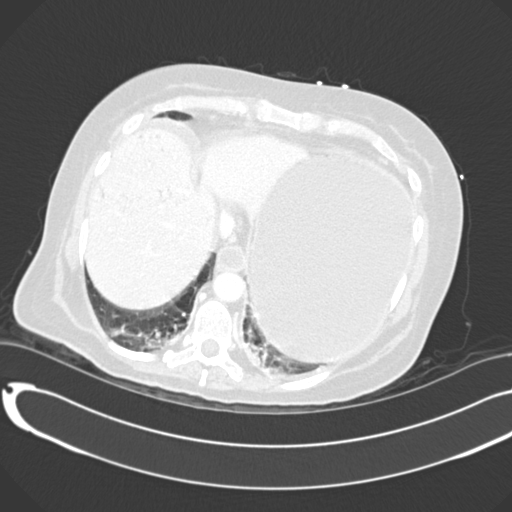
[im 76/101  lung]
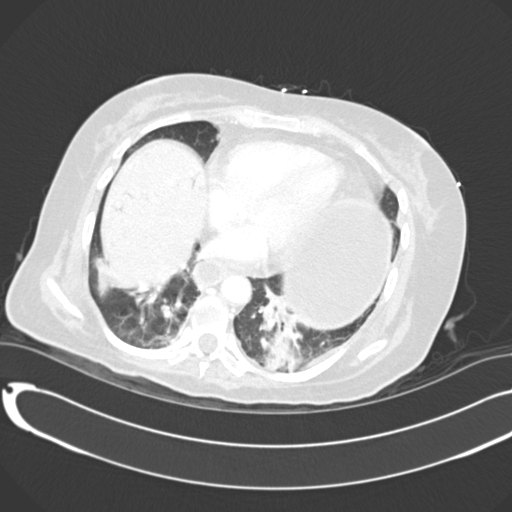

[16 of 32 positions shown; findings below may reference images not displayed]

FINDINGS: CTA CHEST FINDINGS

Mediastinum/Nodes: Respiratory motion somewhat limits study. No
visible filling defects in the pulmonary arteries to suggest
pulmonary emboli. Heart is normal size. No mediastinal, hilar, or
axillary adenopathy. Esophagus is fluid-filled.

Lungs/Pleura: Linear densities in the lung bases compatible with
atelectasis. No effusions.

Musculoskeletal: Chest wall soft tissues are unremarkable.

CT ABDOMEN and PELVIS FINDINGS

Hepatobiliary: There is portal venous gas throughout the liver. No
focal hepatic abnormality. Gallbladder grossly unremarkable.

Pancreas: No focal abnormality or ductal dilatation.

Spleen: No focal abnormality.  Normal size.

Adrenals/Urinary Tract: No adrenal abnormality. No focal renal
abnormality. No stones or hydronephrosis. Urinary bladder is
unremarkable.

Stomach/Bowel: The stomach is markedly distended. There is
pneumatosis involving multiple jejunal small bowel loops which are
dilated. Large bowel grossly unremarkable. Distal ileum is
decompressed suggesting the possibility of distal small bowel
obstruction.

Vascular/Lymphatic: Scattered aortic calcifications. Calcification
at the origin of celiac artery which appears widely patent.
Mesenteric vessels are patent.

Reproductive: Bilateral tubal ligation clips noted. Uterus and
adnexa grossly unremarkable.

Other: Small amount of free fluid in the pelvis no free air.

Musculoskeletal: No acute bony abnormality.

Review of the MIP images confirms the above findings.
IMPRESSION: No evidence of pulmonary embolus.

Bibasilar atelectasis.

Rather extensive jejunal pneumatosis with associated portal venous
gas. Jejunum and proximal ileum are dilated with decompressed distal
ileum. Findings are suggestive of distal small bowel obstruction.

Small amount of free fluid in the pelvis.

Marked distention of the stomach with fluid. Fluid-filled esophagus.

Critical Value/emergent results were called by telephone at the time
of interpretation on 10/28/2015 at [DATE] to Dr. KORDO FUAAD ,
who verbally acknowledged these results.

## 2017-02-18 IMAGING — CR DG CHEST 1V PORT
2 series · 2 of 2 positions shown · non-contrast
Comparison: CTA chest 2627 hours today reported separately.

CLINICAL DATA: 59-year-old male status post intubation. Initial
encounter. Anal squamous cell carcinoma.

EXAM:
PORTABLE CHEST 1 VIEW

[ap portable (1 of 2)]
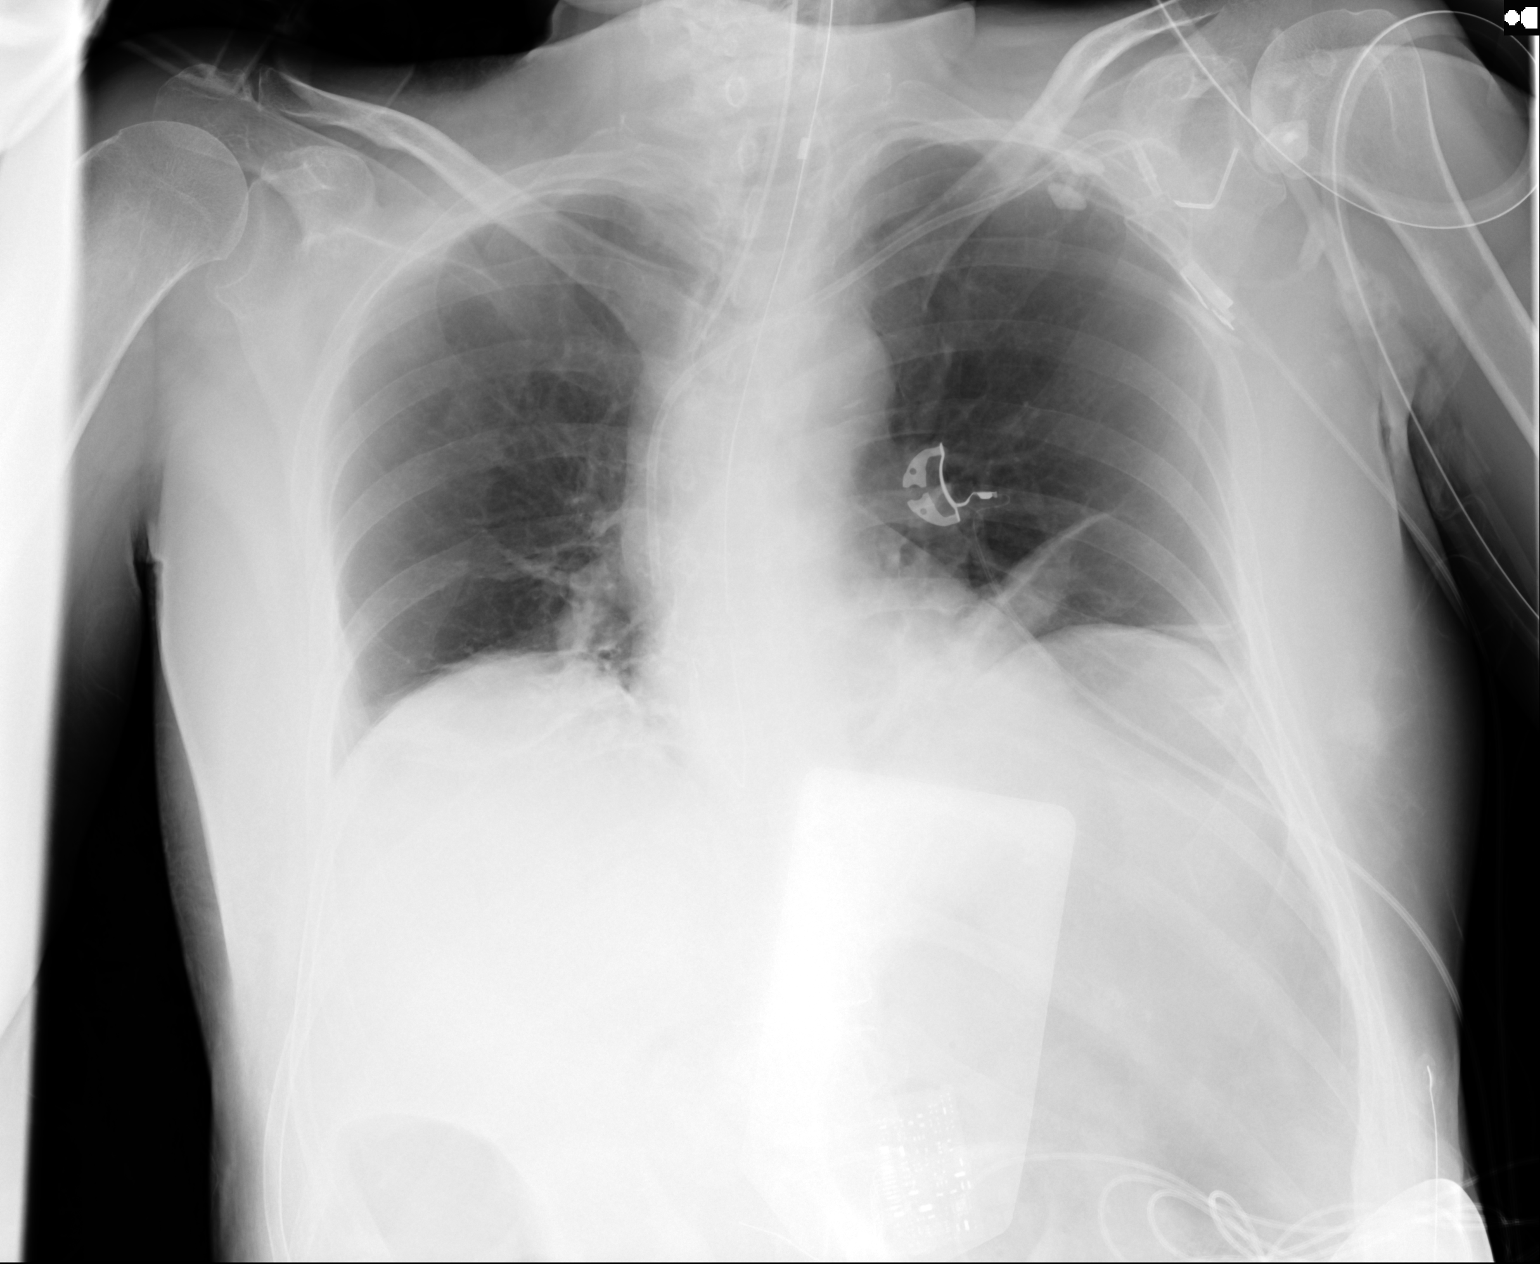

[ap portable (2 of 2)]
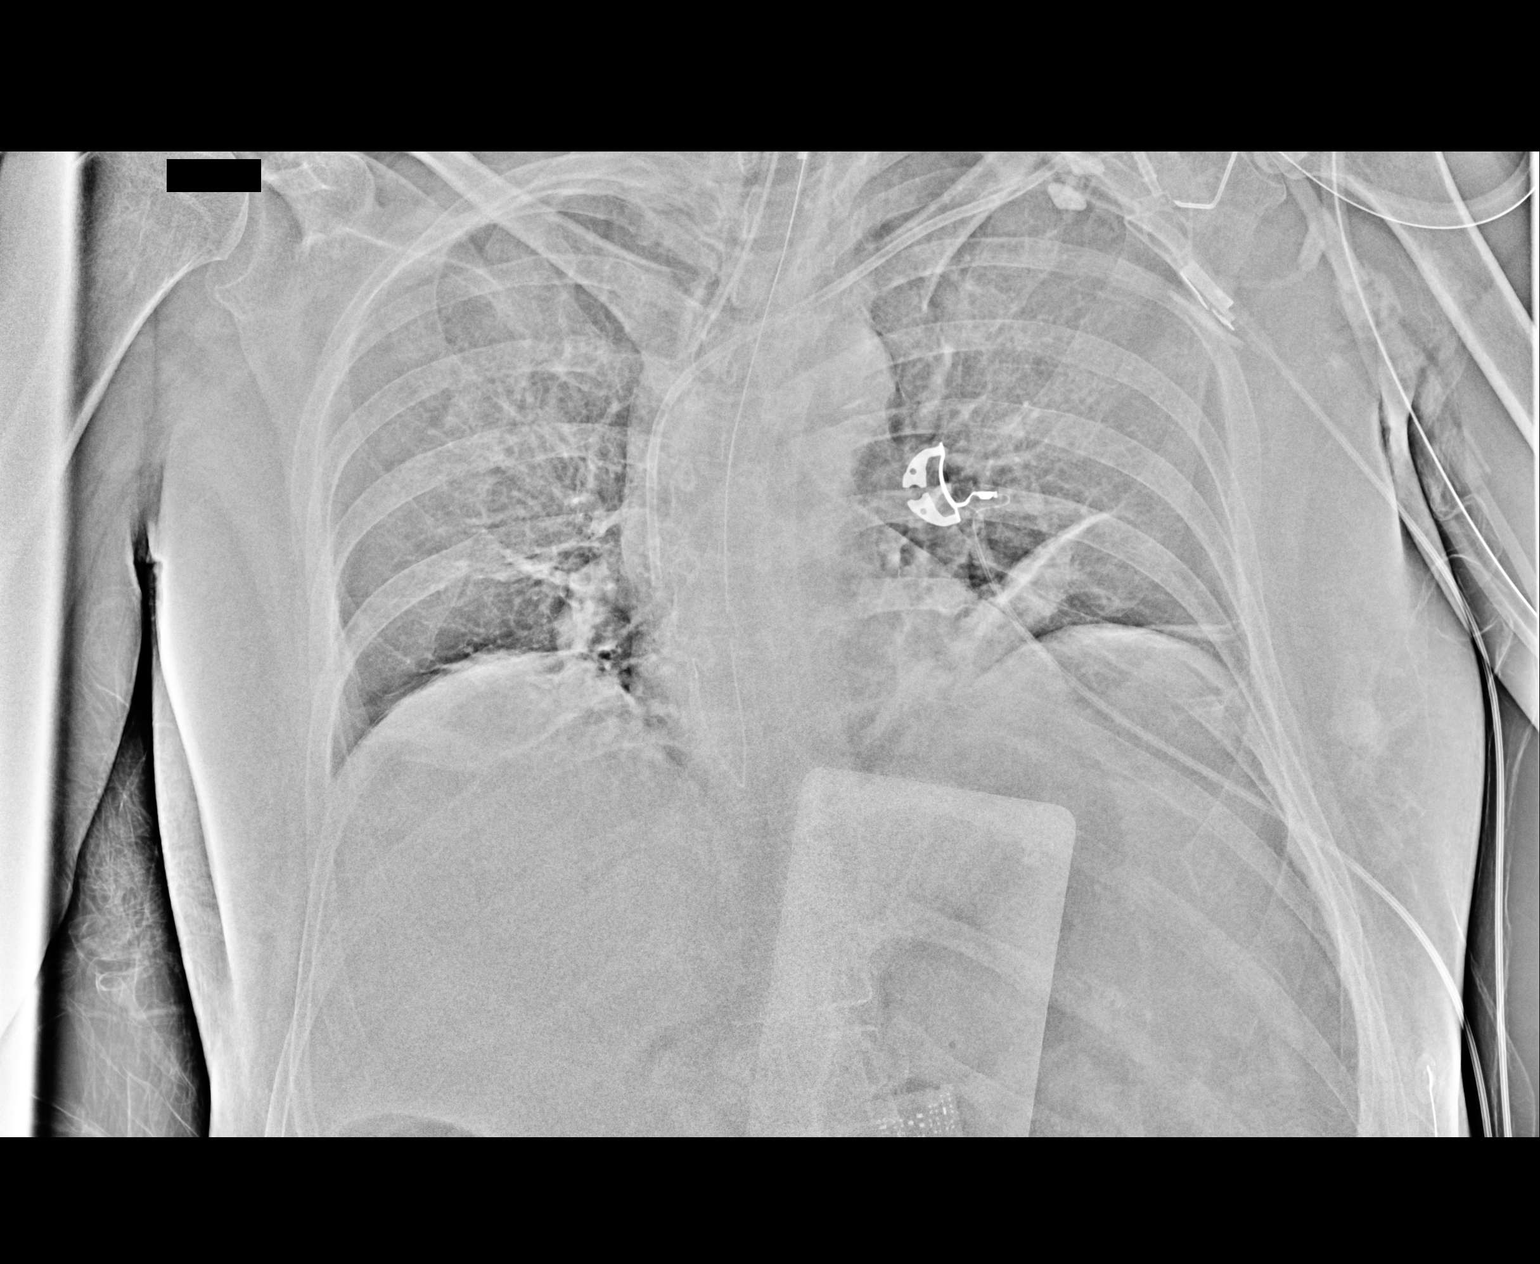

[2 of 2 positions shown; findings below may reference images not displayed]

FINDINGS: Portable AP view at 2600 hours. Endotracheal tube now in place, tip
in good position at the level the clavicles.

Enteric tube in place, side hole at the level of the distal thoracic
esophagus, an the tip appears to be looped near the level of the
gastroesophageal junction. Stable left chest Port-A-Cath.

Mediastinal contours remain normal. Low lung volumes with bibasilar
platelike opacity most compatible with segmental atelectasis. No
pneumothorax. No pulmonary edema or pleural effusion.
IMPRESSION: 1. ET tube in good position.
2. Enteric tube loops in the distal esophagus near the GEJ. Side
hole to level of the lower thoracic esophagus.
3. Low lung volumes with atelectasis.
